# Patient Record
Sex: Male | Born: 1985 | Race: White | Hispanic: No | Marital: Married | Smoking: Current every day smoker
Health system: Southern US, Community
[De-identification: ages and names within clinical notes are randomized; demographics above are authoritative.]

## PROBLEM LIST (undated history)

## (undated) DIAGNOSIS — E782 Mixed hyperlipidemia: Secondary | ICD-10-CM

## (undated) DIAGNOSIS — K219 Gastro-esophageal reflux disease without esophagitis: Secondary | ICD-10-CM

## (undated) DIAGNOSIS — F411 Generalized anxiety disorder: Secondary | ICD-10-CM

## (undated) HISTORY — DX: Gastro-esophageal reflux disease without esophagitis: K21.9

## (undated) HISTORY — DX: Generalized anxiety disorder: F41.1

## (undated) HISTORY — PX: HERNIA REPAIR: SHX51

## (undated) HISTORY — DX: Mixed hyperlipidemia: E78.2

---

## 2015-01-08 ENCOUNTER — Encounter: Payer: Self-pay | Admitting: *Deleted

## 2015-01-08 ENCOUNTER — Telehealth: Payer: Self-pay | Admitting: Family Medicine

## 2015-01-08 NOTE — Telephone Encounter (Signed)
Pt given new pt appt with Lynwood Dawleyiffany Gann 5/17 at 9:30 and aware to arrive 30 minutes prior with a copy of insurance card. Pt states he doesn't take any medications on a regular basis currently.

## 2015-02-06 ENCOUNTER — Ambulatory Visit (INDEPENDENT_AMBULATORY_CARE_PROVIDER_SITE_OTHER): Payer: BLUE CROSS/BLUE SHIELD

## 2015-02-06 ENCOUNTER — Ambulatory Visit (INDEPENDENT_AMBULATORY_CARE_PROVIDER_SITE_OTHER): Payer: BLUE CROSS/BLUE SHIELD | Admitting: Physician Assistant

## 2015-02-06 ENCOUNTER — Encounter: Payer: Self-pay | Admitting: Physician Assistant

## 2015-02-06 VITALS — HR 70 | Temp 97.7°F | Ht 70.0 in | Wt 208.0 lb

## 2015-02-06 DIAGNOSIS — R2233 Localized swelling, mass and lump, upper limb, bilateral: Secondary | ICD-10-CM

## 2015-02-06 DIAGNOSIS — R2243 Localized swelling, mass and lump, lower limb, bilateral: Secondary | ICD-10-CM | POA: Diagnosis not present

## 2015-02-06 DIAGNOSIS — Z0000 Encounter for general adult medical examination without abnormal findings: Secondary | ICD-10-CM

## 2015-02-06 DIAGNOSIS — K219 Gastro-esophageal reflux disease without esophagitis: Secondary | ICD-10-CM

## 2015-02-06 LAB — POCT CBC
GRANULOCYTE PERCENT: 53.1 % (ref 37–80)
HCT, POC: 49.6 % (ref 43.5–53.7)
Hemoglobin: 16.1 g/dL (ref 14.1–18.1)
Lymph, poc: 2.2 (ref 0.6–3.4)
MCH, POC: 28.5 pg (ref 27–31.2)
MCHC: 32.4 g/dL (ref 31.8–35.4)
MCV: 88 fL (ref 80–97)
MPV: 7.7 fL (ref 0–99.8)
POC Granulocyte: 2.9 (ref 2–6.9)
POC LYMPH PERCENT: 40.8 %L (ref 10–50)
Platelet Count, POC: 176 10*3/uL (ref 142–424)
RBC: 5.63 M/uL (ref 4.69–6.13)
RDW, POC: 13 %
WBC: 5.4 10*3/uL (ref 4.6–10.2)

## 2015-02-06 MED ORDER — FAMOTIDINE 20 MG PO TABS: 20.0000 mg | ORAL_TABLET | Freq: Every day | ORAL | Status: DC

## 2015-02-06 NOTE — Progress Notes (Signed)
   Subjective:    Patient ID: Andre Hall, male    DOB: [birth year], 29 y.o.   MRN: [id number]  HPI 29 y/o male presents for establishment of care. He would like baseline labs. No complaints on todays visit.     Review of Systems  Constitutional: Negative.   HENT: Negative.   Eyes: Negative.   Respiratory: Negative.   Cardiovascular: Negative.   Gastrointestinal: Negative for nausea, vomiting, diarrhea, constipation and blood in stool.       Heartburn. Has tried Tums and baking soda with no relief  Endocrine: Negative.   Genitourinary: Negative.   Musculoskeletal: Negative.   Skin: Negative.   Allergic/Immunologic: Negative.   Neurological: Negative.   Hematological: Negative.   Psychiatric/Behavioral: Negative.        Objective:   Physical Exam  Constitutional: He is oriented to person, place, and time. He appears well-developed and well-nourished.  HENT:  Head: Normocephalic.  Right Ear: External ear normal.  Left Ear: External ear normal.  Nose: Nose normal.  Mouth/Throat: Oropharynx is clear and moist. No oropharyngeal exudate.  Eyes: Conjunctivae are normal. Pupils are equal, round, and reactive to light. Right eye exhibits no discharge. Left eye exhibits no discharge.  Neck: Normal range of motion. No thyromegaly present.  Cardiovascular: Normal rate, regular rhythm, normal heart sounds and intact distal pulses.  Exam reveals no gallop.   No murmur heard. Pulmonary/Chest: Effort normal and breath sounds normal. No respiratory distress. He has no wheezes. He has no rales. He exhibits no tenderness.  Abdominal: Soft.  Musculoskeletal: Normal range of motion. He exhibits no edema or tenderness.  Mobile nodules on bilateral 3rd fingers symmetric, resembling cyst Slightly mobile masses on bilateral arches of feet, symmetrical    Lymphadenopathy:    He has no cervical adenopathy.  Neurological: He is alert and oriented to person, place, and time.  Psychiatric: He has  a normal mood and affect. His behavior is normal. Judgment and thought content normal.  Nursing note and vitals reviewed.         Assessment & Plan:  1. Mass of hand, bilateral  - Rheumatoid factor - DG Hand Complete Left - DG Hand Complete Right - Ambulatory referral to Orthopedic Surgery  2. Preventative health care  - POCT CBC - CMP14+EGFR - Lipid panel - Vit D  25 hydroxy (rtn osteoporosis monitoring)  3. Gastroesophageal reflux disease, esophagitis presence not specified  - famotidine (PEPCID) 20 MG tablet; Take 1 tablet (20 mg total) by mouth daily.  Dispense: 30 tablet; Refill: 2  4. Mass of foot, bilateral  - Ambulatory referral to Orthopedic Surgery   Continue all meds Labs pending Health Maintenance reviewed Diet and exercise encouraged RTO 3 months   Rilyn Upshaw A. Benjamin Stain PA-C

## 2015-02-06 NOTE — Patient Instructions (Signed)
Gastroesophageal Reflux Disease, Adult °Gastroesophageal reflux disease (GERD) happens when acid from your stomach goes into your food pipe (esophagus). The acid can cause a burning feeling in your chest. Over time, the acid can make small holes (ulcers) in your food pipe.  °HOME CARE °· Ask your doctor for advice about: °¨ Losing weight. °¨ Quitting smoking. °¨ Alcohol use. °· Avoid foods and drinks that make your problems worse. You may want to avoid: °¨ Caffeine and alcohol. °¨ Chocolate. °¨ Mints. °¨ Garlic and onions. °¨ Spicy foods. °¨ Citrus fruits, such as oranges, lemons, or limes. °¨ Foods that contain tomato, such as sauce, chili, salsa, and pizza. °¨ Fried and fatty foods. °· Avoid lying down for 3 hours before you go to bed or before you take a nap. °· Eat small meals often, instead of large meals. °· Wear loose-fitting clothing. Do not wear anything tight around your waist. °· Raise (elevate) the head of your bed 6 to 8 inches with wood blocks. Using extra pillows does not help. °· Only take medicines as told by your doctor. °· Do not take aspirin or ibuprofen. °GET HELP RIGHT AWAY IF:  °· You have pain in your arms, neck, jaw, teeth, or back. °· Your pain gets worse or changes. °· You feel sick to your stomach (nauseous), throw up (vomit), or sweat (diaphoresis). °· You feel short of breath, or you pass out (faint). °· Your throw up is green, yellow, black, or looks like coffee grounds or blood. °· Your poop (stool) is red, bloody, or black. °MAKE SURE YOU:  °· Understand these instructions. °· Will watch your condition. °· Will get help right away if you are not doing well or get worse. °Document Released: 02/25/2008 Document Revised: 12/01/2011 Document Reviewed: 03/28/2011 °ExitCare® Patient Information ©2015 [employer]. This information is not intended to replace advice given to you by your health care provider. Make sure you discuss any questions you have with your health care provider. °Food  Choices for Gastroesophageal Reflux Disease °When you have gastroesophageal reflux disease (GERD), the foods you eat and your eating habits are very important. Choosing the right foods can help ease the discomfort of GERD. °WHAT GENERAL GUIDELINES DO I NEED TO FOLLOW? °· Choose fruits, vegetables, whole grains, low-fat dairy products, and low-fat meat, fish, and poultry. °· Limit fats such as oils, salad dressings, butter, nuts, and avocado. °· Keep a food diary to identify foods that cause symptoms. °· Avoid foods that cause reflux. These may be different for different people. °· Eat frequent small meals instead of three large meals each day. °· Eat your meals slowly, in a relaxed setting. °· Limit fried foods. °· Cook foods using methods other than frying. °· Avoid drinking alcohol. °· Avoid drinking large amounts of liquids with your meals. °· Avoid bending over or lying down until 2-3 hours after eating. °WHAT FOODS ARE NOT RECOMMENDED? °The following are some foods and drinks that may worsen your symptoms: °Vegetables °Tomatoes. Tomato juice. Tomato and spaghetti sauce. Chili peppers. Onion and garlic. Horseradish. °Fruits °Oranges, grapefruit, and lemon (fruit and juice). °Meats °High-fat meats, fish, and poultry. This includes hot dogs, ribs, ham, sausage, salami, and bacon. °Dairy °Whole milk and chocolate milk. Sour cream. Cream. Butter. Ice cream. Cream cheese.  °Beverages °Coffee and tea, with or without caffeine. Carbonated beverages or energy drinks. °Condiments °Hot sauce. Barbecue sauce.  °Sweets/Desserts °Chocolate and cocoa. Donuts. Peppermint and spearmint. °Fats and Oils °High-fat foods, including French fries and   potato chips. °Other °Vinegar. Strong spices, such as black pepper, white pepper, red pepper, cayenne, curry powder, cloves, ginger, and chili powder. °The items listed above may not be a complete list of foods and beverages to avoid. Contact your dietitian for more  information. °Document Released: 09/08/2005 Document Revised: 09/13/2013 Document Reviewed: 07/13/2013 °ExitCare® Patient Information ©2015 [employer]. This information is not intended to replace advice given to you by your health care provider. Make sure you discuss any questions you have with your health care provider. ° °

## 2015-02-07 ENCOUNTER — Other Ambulatory Visit: Payer: Self-pay | Admitting: Physician Assistant

## 2015-02-07 DIAGNOSIS — E7889 Other lipoprotein metabolism disorders: Secondary | ICD-10-CM

## 2015-02-07 DIAGNOSIS — R899 Unspecified abnormal finding in specimens from other organs, systems and tissues: Secondary | ICD-10-CM

## 2015-02-07 DIAGNOSIS — R229 Localized swelling, mass and lump, unspecified: Secondary | ICD-10-CM

## 2015-02-07 LAB — CMP14+EGFR
A/G RATIO: 2.5 (ref 1.1–2.5)
ALK PHOS: 66 IU/L (ref 39–117)
ALT: 14 IU/L (ref 0–44)
AST: 15 IU/L (ref 0–40)
Albumin: 4.7 g/dL (ref 3.5–5.5)
BUN/Creatinine Ratio: 19 (ref 8–19)
BUN: 19 mg/dL (ref 6–20)
Bilirubin Total: 0.5 mg/dL (ref 0.0–1.2)
CO2: 24 mmol/L (ref 18–29)
Calcium: 9.3 mg/dL (ref 8.7–10.2)
Chloride: 101 mmol/L (ref 97–108)
Creatinine, Ser: 1.01 mg/dL (ref 0.76–1.27)
GFR calc Af Amer: 117 mL/min/{1.73_m2} (ref >59)
GFR, EST NON AFRICAN AMERICAN: 101 mL/min/{1.73_m2} (ref >59)
GLOBULIN, TOTAL: 1.9 g/dL (ref 1.5–4.5)
Glucose: 107 mg/dL — ABNORMAL HIGH (ref 65–99)
Potassium: 4.5 mmol/L (ref 3.5–5.2)
SODIUM: 139 mmol/L (ref 134–144)
Total Protein: 6.6 g/dL (ref 6.0–8.5)

## 2015-02-07 LAB — RHEUMATOID FACTOR: Rhuematoid fact SerPl-aCnc: 14.6 IU/mL — ABNORMAL HIGH (ref 0.0–13.9)

## 2015-02-07 LAB — VITAMIN D 25 HYDROXY (VIT D DEFICIENCY, FRACTURES): VIT D 25 HYDROXY: 19.6 ng/mL — AB (ref 30.0–100.0)

## 2015-02-07 LAB — LIPID PANEL
Chol/HDL Ratio: 9.8 ratio units — ABNORMAL HIGH (ref 0.0–5.0)
Cholesterol, Total: 176 mg/dL (ref 100–199)
HDL: 18 mg/dL — ABNORMAL LOW (ref >39)
TRIGLYCERIDES: 858 mg/dL — AB (ref 0–149)

## 2015-02-08 ENCOUNTER — Telehealth: Payer: Self-pay | Admitting: *Deleted

## 2015-02-08 NOTE — Telephone Encounter (Signed)
lmtcb regarding test results. 

## 2015-02-08 NOTE — Telephone Encounter (Signed)
-----   Message from Chelsea Primusiffany A Gann, PA-C sent at 02/07/2015 11:09 AM EDT ----- Triglycerides very elevated.  +Rhematoid factor. Add'l tests needed Have patient rtc fasting asap for repeat of labs  Tiffany A. Chauncey ReadingGann PA-C

## 2015-02-15 ENCOUNTER — Other Ambulatory Visit (INDEPENDENT_AMBULATORY_CARE_PROVIDER_SITE_OTHER): Payer: BLUE CROSS/BLUE SHIELD

## 2015-02-15 DIAGNOSIS — R229 Localized swelling, mass and lump, unspecified: Secondary | ICD-10-CM | POA: Diagnosis not present

## 2015-02-15 DIAGNOSIS — E7889 Other lipoprotein metabolism disorders: Secondary | ICD-10-CM

## 2015-02-15 DIAGNOSIS — R899 Unspecified abnormal finding in specimens from other organs, systems and tissues: Secondary | ICD-10-CM

## 2015-02-15 NOTE — Progress Notes (Signed)
Lab only 

## 2015-02-16 LAB — LIPID PANEL
CHOL/HDL RATIO: 9.4 ratio — AB (ref 0.0–5.0)
Cholesterol, Total: 141 mg/dL (ref 100–199)
HDL: 15 mg/dL — AB (ref >39)
Triglycerides: 604 mg/dL (ref 0–149)

## 2015-02-16 LAB — C-REACTIVE PROTEIN: CRP: 2 mg/L (ref 0.0–4.9)

## 2015-02-16 LAB — RHEUMATOID FACTOR: Rhuematoid fact SerPl-aCnc: 8.8 IU/mL (ref 0.0–13.9)

## 2015-02-16 LAB — SEDIMENTATION RATE: Sed Rate: 2 mm/hr (ref 0–15)

## 2015-02-16 LAB — URIC ACID: URIC ACID: 8.9 mg/dL — AB (ref 3.7–8.6)

## 2015-02-16 LAB — ANA: Anti Nuclear Antibody(ANA): NEGATIVE

## 2015-03-05 ENCOUNTER — Ambulatory Visit (INDEPENDENT_AMBULATORY_CARE_PROVIDER_SITE_OTHER): Payer: BLUE CROSS/BLUE SHIELD | Admitting: Pharmacist

## 2015-03-05 ENCOUNTER — Encounter: Payer: Self-pay | Admitting: Pharmacist

## 2015-03-05 VITALS — BP 138/88 | HR 84 | Ht 70.0 in | Wt 203.0 lb

## 2015-03-05 DIAGNOSIS — R7989 Other specified abnormal findings of blood chemistry: Secondary | ICD-10-CM

## 2015-03-05 DIAGNOSIS — E782 Mixed hyperlipidemia: Secondary | ICD-10-CM | POA: Diagnosis not present

## 2015-03-05 DIAGNOSIS — E790 Hyperuricemia without signs of inflammatory arthritis and tophaceous disease: Secondary | ICD-10-CM | POA: Insufficient documentation

## 2015-03-05 DIAGNOSIS — R7309 Other abnormal glucose: Secondary | ICD-10-CM | POA: Diagnosis not present

## 2015-03-05 HISTORY — DX: Mixed hyperlipidemia: E78.2

## 2015-03-05 LAB — GLUCOSE, POCT (MANUAL RESULT ENTRY): POC Glucose: 102 mg/dl — AB (ref 70–99)

## 2015-03-05 LAB — POCT GLYCOSYLATED HEMOGLOBIN (HGB A1C): Hemoglobin A1C: 4.8

## 2015-03-05 MED ORDER — OMEPRAZOLE 20 MG PO CPDR: 20.0000 mg | DELAYED_RELEASE_CAPSULE | Freq: Every day | ORAL | Status: DC

## 2015-03-05 NOTE — Patient Instructions (Signed)
Managing Your High Blood Pressure Blood pressure is a measurement of how forceful your blood is pressing against the walls of the arteries. Arteries are muscular tubes within the circulatory system. Blood pressure does not stay the same. Blood pressure rises when you are active, excited, or nervous; and it lowers during sleep and relaxation. If the numbers measuring your blood pressure stay above normal most of the time, you are at risk for health problems. High blood pressure (hypertension) is a long-term (chronic) condition in which blood pressure is elevated. A blood pressure reading is recorded as two numbers, such as 120 over 80 (or 120/80). The first, higher number is called the systolic pressure. It is a measure of the pressure in your arteries as the heart beats. The second, lower number is called the diastolic pressure. It is a measure of the pressure in your arteries as the heart relaxes between beats.  Keeping your blood pressure in a normal range is important to your overall health and prevention of health problems, such as heart disease and stroke. When your blood pressure is uncontrolled, your heart has to work harder than normal. High blood pressure is a very common condition in adults because blood pressure tends to rise with age. Men and women are equally likely to have hypertension but at different times in life. Before age 45, men are more likely to have hypertension. After 27 years of age, women are more likely to have it. Hypertension is especially common in African Americans. This condition often has no signs or symptoms. The cause of the condition is usually not known. Your caregiver can help you come up with a plan to keep your blood pressure in a normal, healthy range. BLOOD PRESSURE STAGES Blood pressure is classified into four stages: normal, prehypertension, stage 1, and stage 2. Your blood pressure reading will be used to determine what type of treatment, if any, is necessary.  Appropriate treatment options are tied to these four stages:  Normal  Systolic pressure (mm Hg): below 120.  Diastolic pressure (mm Hg): below 80. Prehypertension  Systolic pressure (mm Hg): 120 to 139.  Diastolic pressure (mm Hg): 80 to 89. Stage1  Systolic pressure (mm Hg): 140 to 159.  Diastolic pressure (mm Hg): 90 to 99. Stage2  Systolic pressure (mm Hg): 160 or above.  Diastolic pressure (mm Hg): 100 or above. RISKS RELATED TO HIGH BLOOD PRESSURE Managing your blood pressure is an important responsibility. Uncontrolled high blood pressure can lead to:  A heart attack.  A stroke.  A weakened blood vessel (aneurysm).  Heart failure.  Kidney damage.  Eye damage.  Metabolic syndrome.  Memory and concentration problems. HOW TO MANAGE YOUR BLOOD PRESSURE Blood pressure can be managed effectively with lifestyle changes and medicines (if needed). Your caregiver will help you come up with a plan to bring your blood pressure within a normal range. Your plan should include the following: Education  Read all information provided by your caregivers about how to control blood pressure.  Educate yourself on the latest guidelines and treatment recommendations. New research is always being done to further define the risks and treatments for high blood pressure. Lifestylechanges  Control your weight.  Avoid smoking.  Stay physically active.  Reduce the amount of salt in your diet.  Reduce stress.  Control any chronic conditions, such as high cholesterol or diabetes.  Reduce your alcohol intake. Medicines  Several medicines (antihypertensive medicines) are available, if needed, to bring blood pressure within a normal range.   Communication  Review all the medicines you take with your caregiver because there may be side effects or interactions.  Talk with your caregiver about your diet, exercise habits, and other lifestyle factors that may be contributing to  high blood pressure.  See your caregiver regularly. Your caregiver can help you create and adjust your plan for managing high blood pressure. RECOMMENDATIONS FOR TREATMENT AND FOLLOW-UP  The following recommendations are based on current guidelines for managing high blood pressure in nonpregnant adults. Use these recommendations to identify the proper follow-up period or treatment option based on your blood pressure reading. You can discuss these options with your caregiver.  Systolic pressure of 120 to 139 or diastolic pressure of 80 to 89: Follow up with your caregiver as directed.  Systolic pressure of 140 to 160 or diastolic pressure of 90 to 100: Follow up with your caregiver within 2 months.  Systolic pressure above 160 or diastolic pressure above 100: Follow up with your caregiver within 1 month.  Systolic pressure above 180 or diastolic pressure above 110: Consider antihypertensive therapy; follow up with your caregiver within 1 week.  Systolic pressure above 200 or diastolic pressure above 120: Begin antihypertensive therapy; follow up with your caregiver within 1 week. Document Released: 06/02/2012 Document Reviewed: 06/02/2012 ExitCare Patient Information 2015 [employer]. This information is not intended to replace advice given to you by your health care provider. Make sure you discuss any questions you have with your health care provider.  DASH Eating Plan DASH stands for "Dietary Approaches to Stop Hypertension." The DASH eating plan is a healthy eating plan that has been shown to reduce high blood pressure (hypertension). Additional health benefits may include reducing the risk of type 2 diabetes mellitus, heart disease, and stroke. The DASH eating plan may also help with weight loss. WHAT DO I NEED TO KNOW ABOUT THE DASH EATING PLAN? For the DASH eating plan, you will follow these general guidelines:  Choose foods with a percent daily value for sodium of less than 5% (as  listed on the food label).  Use salt-free seasonings or herbs instead of table salt or sea salt.  Check with your health care provider or pharmacist before using salt substitutes.  Eat lower-sodium products, often labeled as "lower sodium" or "no salt added."  Eat fresh foods.  Eat more vegetables, fruits, and low-fat dairy products.  Choose whole grains. Look for the word "whole" as the first word in the ingredient list.  Choose fish and skinless chicken or turkey more often than red meat. Limit fish, poultry, and meat to 6 oz (170 g) each day.  Limit sweets, desserts, sugars, and sugary drinks.  Choose heart-healthy fats.  Limit cheese to 1 oz (28 g) per day.  Eat more home-cooked food and less restaurant, buffet, and fast food.  Limit fried foods.  Cook foods using methods other than frying.  Limit canned vegetables. If you do use them, rinse them well to decrease the sodium.  When eating at a restaurant, ask that your food be prepared with less salt, or no salt if possible. WHAT FOODS CAN I EAT? Seek help from a dietitian for individual calorie needs. Grains Whole grain or whole wheat bread. Brown rice. Whole grain or whole wheat pasta. Quinoa, bulgur, and whole grain cereals. Low-sodium cereals. Corn or whole wheat flour tortillas. Whole grain cornbread. Whole grain crackers. Low-sodium crackers. Vegetables Fresh or frozen vegetables (raw, steamed, roasted, or grilled). Low-sodium or reduced-sodium tomato and vegetable juices. Low-sodium   or reduced-sodium tomato sauce and paste. Low-sodium or reduced-sodium canned vegetables.  Fruits All fresh, canned (in natural juice), or frozen fruits. Meat and Other Protein Products Ground beef (85% or leaner), grass-fed beef, or beef trimmed of fat. Skinless chicken or turkey. Ground chicken or turkey. Pork trimmed of fat. All fish and seafood. Eggs. Dried beans, peas, or lentils. Unsalted nuts and seeds. Unsalted canned  beans. Dairy Low-fat dairy products, such as skim or 1% milk, 2% or reduced-fat cheeses, low-fat ricotta or cottage cheese, or plain low-fat yogurt. Low-sodium or reduced-sodium cheeses. Fats and Oils Tub margarines without trans fats. Light or reduced-fat mayonnaise and salad dressings (reduced sodium). Avocado. Safflower, olive, or canola oils. Natural peanut or almond butter. Other Unsalted popcorn and pretzels. The items listed above may not be a complete list of recommended foods or beverages. Contact your dietitian for more options. WHAT FOODS ARE NOT RECOMMENDED? Grains White bread. White pasta. White rice. Refined cornbread. Bagels and croissants. Crackers that contain trans fat. Vegetables Creamed or fried vegetables. Vegetables in a cheese sauce. Regular canned vegetables. Regular canned tomato sauce and paste. Regular tomato and vegetable juices. Fruits Dried fruits. Canned fruit in light or heavy syrup. Fruit juice. Meat and Other Protein Products Fatty cuts of meat. Ribs, chicken wings, bacon, sausage, bologna, salami, chitterlings, fatback, hot dogs, bratwurst, and packaged luncheon meats. Salted nuts and seeds. Canned beans with salt. Dairy Whole or 2% milk, cream, half-and-half, and cream cheese. Whole-fat or sweetened yogurt. Full-fat cheeses or blue cheese. Nondairy creamers and whipped toppings. Processed cheese, cheese spreads, or cheese curds. Condiments Onion and garlic salt, seasoned salt, table salt, and sea salt. Canned and packaged gravies. Worcestershire sauce. Tartar sauce. Barbecue sauce. Teriyaki sauce. Soy sauce, including reduced sodium. Steak sauce. Fish sauce. Oyster sauce. Cocktail sauce. Horseradish. Ketchup and mustard. Meat flavorings and tenderizers. Bouillon cubes. Hot sauce. Tabasco sauce. Marinades. Taco seasonings. Relishes. Fats and Oils Butter, stick margarine, lard, shortening, ghee, and bacon fat. Coconut, palm kernel, or palm oils. Regular salad  dressings. Other Pickles and olives. Salted popcorn and pretzels. The items listed above may not be a complete list of foods and beverages to avoid. Contact your dietitian for more information. WHERE CAN I FIND MORE INFORMATION? National Heart, Lung, and [employer] Institute: www.nhlbi.nih.gov/health/health-topics/topics/dash/ Document Released: 08/28/2011 Document Revised: 01/23/2014 Document Reviewed: 07/13/2013 ExitCare Patient Information 2015 [employer]. This information is not intended to replace advice given to you by your health care provider. Make sure you discuss any questions you have with your health care provider.  

## 2015-03-05 NOTE — Progress Notes (Signed)
Subjective:    Andre Hall is a 29 y.o. male who presents for evaluation of dyslipidemia. The patient does not use medications that may worsen dyslipidemias (corticosteroids, progestins, anabolic steroids, diuretics, beta-blockers, amiodarone, cyclosporine, olanzapine). Exercise: rarely. Previous history of cardiac disease includes: None.  Cardiac Risk Factors Age > 45-male, > 55-male:  NO  Smoking:   YES  +1  Sig. family hx of CHD*:  NO  Hypertension:   NO  Diabetes:   NO  HDL < 35:   YES  +1  HDL > 59:   NO  Total:  2   *Significant family history of CHD per NCEP = MI or sudden death at less than 55 year old in father or other 1st-degree male relative, or less than 20 year old in mother or  other 1st-degree male relative  The following portions of the patient's history were reviewed and updated as appropriate: allergies, current medications, past family history, past medical history, past social history, past surgical history and problem list.  Diet - 1 or 2 sodas or energy drinks per day.  Alcohol - mostly on weekends - and usually has greater than 4 drinks per day on weekends.  Eats out daily for lunch.   Lots of red meat.   Objective:   Lab Review Lab on 02/15/2015  Component Date Value  . Cholesterol, Total 02/15/2015 141   . Triglycerides 02/15/2015 604*  . HDL 02/15/2015 15*  . VLDL Cholesterol Cal 02/15/2015 Comment   . LDL Calculated 02/15/2015 Comment   . Chol/HDL Ratio 02/15/2015 9.4*  . Rhuematoid fact SerPl-aC* 02/15/2015 8.8   . Sed Rate 02/15/2015 2   . Uric Acid 02/15/2015 8.9*  . CRP 02/15/2015 2.0   . Anit Nuclear Antibody(AN* 02/15/2015 Negative   Office Visit on 02/06/2015  Component Date Value  . WBC 02/06/2015 5.4   . Lymph, poc 02/06/2015 2.2   . POC LYMPH PERCENT 02/06/2015 40.8   . POC Granulocyte 02/06/2015 2.9   . Granulocyte percent 02/06/2015 53.1   . RBC 02/06/2015 5.63   . Hemoglobin 02/06/2015 16.1   . HCT, POC 02/06/2015 49.6    . MCV 02/06/2015 88.0   . MCH, POC 02/06/2015 28.5   . MCHC 02/06/2015 32.4   . RDW, POC 02/06/2015 13.0   . Platelet Count, POC 02/06/2015 176   . MPV 02/06/2015 7.7   . Glucose 02/06/2015 107*  . BUN 02/06/2015 19   . Creatinine, Ser 02/06/2015 1.01   . GFR calc non Af Amer 02/06/2015 101   . GFR calc Af Amer 02/06/2015 117   . BUN/Creatinine Ratio 02/06/2015 19   . Sodium 02/06/2015 139   . Potassium 02/06/2015 4.5   . Chloride 02/06/2015 101   . CO2 02/06/2015 24   . Calcium 02/06/2015 9.3   . Total Protein 02/06/2015 6.6   . Albumin 02/06/2015 4.7   . Globulin, Total 02/06/2015 1.9   . Albumin/Globulin Ratio 02/06/2015 2.5   . Bilirubin Total 02/06/2015 0.5   . Alkaline Phosphatase 02/06/2015 66   . AST 02/06/2015 15   . ALT 02/06/2015 14   . Cholesterol, Total 02/06/2015 176   . Triglycerides 02/06/2015 858*  . HDL 02/06/2015 18*  . VLDL Cholesterol Cal 02/06/2015 Comment   . LDL Calculated 02/06/2015 Comment   . Chol/HDL Ratio 02/06/2015 9.8*  . Rhuematoid fact SerPl-aC* 02/06/2015 14.6*  . Vit D, 25-Hydroxy 02/06/2015 19.6*      Assessment:    Dyslipidemia as detailed above  with very elevated triglycerides possibly related to alcohol intake - 2 CHD risk factors using NCEP scheme above.  Target levels for LDL are: < 100 mg/dl (CHD or "CHD risk equivalent" is present)  Target Triglycerides are: < 150 Target HDL: < 40  Elevated uric acid      Plan:    The following changes are planned for the next 4 weeks, at which time the patient will return for repeat fasting lipids:  1. Dietary changes: Discussed d/c or limit alcohol intake, decrease foods with high CHO and sugar content.  Also discussed limiting red meat and alcohol to help decrease uric acid.  Increase non-starchy vegetables - carrots, green bean, squash, zucchini, tomatoes, onions, peppers, spinach and other green leafy vegetables, cabbage, lettuce, cucumbers, asparagus, okra (not fried),  eggplant limit sugar and processed foods (cakes, cookies, ice cream, crackers and chips) Increase fresh fruit but limit serving sizes 1/2 cup or about the size of tennis or baseball limit red meat to no more than 1-2 times per week (serving size about the size of your palm) Choose whole grains / lean proteins - whole wheat bread, quinoa, whole grain rice (1/2 cup), fish, chicken, [place]  2. Exercise changes:  Advised to engage in any type of physical activity and frequency goal is 4 to 5 times a week 3. Other treatment Smoking cessation (patient is at comtemplative stage. Information given. ) 4. Lipid-lowering medications: None at present. Will consider if LDL > 130  on recheck or if Triglycerides do not decrease to less than 200 5. Screening for secondary causes of dyslipidemias:Blood glucose and A1c done today - both were WNL. 6.  Explained to the patient the respective contributions of genetics, diet, and exercise to lipid levels and the use of medication in severe cases which do not respond to lifestyle alteration. The patient's interest and motivation in making lifestyle changes seems fair.    Note: The majority of the visit was spent in counseling on the pathophysiology and treatment of dyslipidemias. The total face-to-face time was in excess of 45 minutes.    Henrene Pastor, PharmD, CPP

## 2015-04-06 ENCOUNTER — Other Ambulatory Visit: Payer: BLUE CROSS/BLUE SHIELD

## 2015-04-06 DIAGNOSIS — E790 Hyperuricemia without signs of inflammatory arthritis and tophaceous disease: Secondary | ICD-10-CM

## 2015-04-06 DIAGNOSIS — E782 Mixed hyperlipidemia: Secondary | ICD-10-CM

## 2015-04-07 LAB — LIPID PANEL
CHOLESTEROL TOTAL: 131 mg/dL (ref 100–199)
Chol/HDL Ratio: 6.6 ratio units — ABNORMAL HIGH (ref 0.0–5.0)
HDL: 20 mg/dL — AB (ref >39)
TRIGLYCERIDES: 438 mg/dL — AB (ref 0–149)

## 2015-04-07 LAB — THYROID PANEL WITH TSH
Free Thyroxine Index: 1.9 (ref 1.2–4.9)
T3 Uptake Ratio: 25 % (ref 24–39)
T4, Total: 7.7 ug/dL (ref 4.5–12.0)
TSH: 1.74 u[IU]/mL (ref 0.450–4.500)

## 2015-04-07 LAB — URIC ACID: Uric Acid: 8.3 mg/dL (ref 3.7–8.6)

## 2015-04-07 LAB — LDL CHOLESTEROL, DIRECT: LDL DIRECT: 62 mg/dL (ref 0–99)

## 2015-04-12 ENCOUNTER — Telehealth: Payer: Self-pay | Admitting: Pharmacist

## 2015-04-12 ENCOUNTER — Other Ambulatory Visit: Payer: Self-pay | Admitting: Pharmacist

## 2015-04-12 DIAGNOSIS — E782 Mixed hyperlipidemia: Secondary | ICD-10-CM

## 2015-04-12 DIAGNOSIS — E790 Hyperuricemia without signs of inflammatory arthritis and tophaceous disease: Secondary | ICD-10-CM

## 2015-04-12 NOTE — Telephone Encounter (Signed)
Discussed results and recommendations with patient via phone.

## 2015-04-12 NOTE — Telephone Encounter (Signed)
Triglycerides have improved greatly over last 2 months but not at goal yet.  Direct LDL was WNL Uric acid is improving and thyroid panel was WNL.  Recommend continue with TLC - limiting sodas, sweets and alcoholic drinks.  Continue fish oil up to  daily.  Recheck lipids/direct LDL and UA in 6 weeks Tried to call patient - left message on VM

## 2015-05-05 ENCOUNTER — Other Ambulatory Visit: Payer: Self-pay | Admitting: Pharmacist

## 2015-05-24 ENCOUNTER — Other Ambulatory Visit: Payer: Self-pay

## 2015-07-09 ENCOUNTER — Telehealth: Payer: Self-pay | Admitting: Pharmacist

## 2015-07-09 NOTE — Telephone Encounter (Signed)
Patient missed appt to recheck labs - called to remind him to come in.

## 2015-07-16 ENCOUNTER — Telehealth: Payer: Self-pay | Admitting: Family Medicine

## 2015-07-24 ENCOUNTER — Telehealth: Payer: Self-pay | Admitting: Pharmacist

## 2015-07-24 NOTE — Telephone Encounter (Signed)
I called to remind patient that he is due to have lipds / LFTs rechecked and noticed that his wife called in last week about him having CP. I left message on both their cell numbers to remind about lipids and to follow up about CP.

## 2015-07-24 NOTE — Telephone Encounter (Signed)
Patient called back - states CP resolved and he did not have any further CP.  I did make him an appt to establish with a new PCP since Tiffany is no longer here and to have labs recheck . He is advised that is he has CP prior to next appt he is to call our office or 911.

## 2015-07-26 NOTE — Telephone Encounter (Signed)
Andre Hall spoke with Andre Hall

## 2015-08-13 ENCOUNTER — Ambulatory Visit: Payer: Self-pay | Admitting: Family Medicine

## 2015-08-14 ENCOUNTER — Encounter: Payer: Self-pay | Admitting: Physician Assistant

## 2015-10-01 ENCOUNTER — Other Ambulatory Visit: Payer: Self-pay | Admitting: Physician Assistant

## 2015-10-31 ENCOUNTER — Other Ambulatory Visit: Payer: Self-pay

## 2015-10-31 MED ORDER — OMEPRAZOLE 20 MG PO CPDR: DELAYED_RELEASE_CAPSULE | ORAL | Status: DC

## 2015-10-31 NOTE — Telephone Encounter (Signed)
Patient is due for follow-up this month. This is the last prescription that will be authorized before the patient is seen 

## 2015-10-31 NOTE — Telephone Encounter (Signed)
Last seen 02/06/16  Tiffany

## 2015-12-01 ENCOUNTER — Other Ambulatory Visit: Payer: Self-pay | Admitting: Family Medicine

## 2016-01-07 ENCOUNTER — Telehealth: Payer: Self-pay | Admitting: Pharmacist

## 2016-01-07 NOTE — Telephone Encounter (Signed)
Patient in need of appt for PCP - will have 02/05/16 off.  Appt made for Dr Ermalinda MemosBradshaw for 02/05/16 at 7:55am

## 2016-02-05 ENCOUNTER — Other Ambulatory Visit: Payer: Self-pay | Admitting: Family Medicine

## 2016-02-05 ENCOUNTER — Ambulatory Visit (INDEPENDENT_AMBULATORY_CARE_PROVIDER_SITE_OTHER): Payer: BLUE CROSS/BLUE SHIELD | Admitting: Family Medicine

## 2016-02-05 ENCOUNTER — Encounter: Payer: Self-pay | Admitting: Family Medicine

## 2016-02-05 ENCOUNTER — Telehealth: Payer: Self-pay | Admitting: Family Medicine

## 2016-02-05 ENCOUNTER — Ambulatory Visit (INDEPENDENT_AMBULATORY_CARE_PROVIDER_SITE_OTHER): Payer: BLUE CROSS/BLUE SHIELD

## 2016-02-05 VITALS — BP 142/88 | HR 68 | Temp 97.7°F | Ht 70.0 in | Wt 213.4 lb

## 2016-02-05 DIAGNOSIS — E790 Hyperuricemia without signs of inflammatory arthritis and tophaceous disease: Secondary | ICD-10-CM

## 2016-02-05 DIAGNOSIS — R0602 Shortness of breath: Secondary | ICD-10-CM

## 2016-02-05 DIAGNOSIS — R7989 Other specified abnormal findings of blood chemistry: Secondary | ICD-10-CM | POA: Diagnosis not present

## 2016-02-05 DIAGNOSIS — R222 Localized swelling, mass and lump, trunk: Secondary | ICD-10-CM

## 2016-02-05 DIAGNOSIS — E782 Mixed hyperlipidemia: Secondary | ICD-10-CM

## 2016-02-05 NOTE — Patient Instructions (Signed)
Great to meet you!  Congrats on quitting smoking!  We will call with your lab results within 1 week  Great job on watching your diet and exercising!

## 2016-02-05 NOTE — Telephone Encounter (Signed)
Nothing happens when you dial the  number on file for this patient; (it doesn't ring, give a busy tone, just dead )

## 2016-02-05 NOTE — Progress Notes (Signed)
   HPI  Patient presents today to establish care with me and to discuss shortest of breath and hypertriglyceridemia.  Patient states that he quit smoking about 3 months ago and had intermittent shortness of breath since that time He describes it as mild intermittent, annoying type shortness of breath. If he does not pay attention to it he never notices it. These exercising regularly running, walking, and lifting weights. He works TEFL teacher.  He's been watching his diet for about 2 months.  He's had elevated triglycerides, cholesterol, and uric acid in the past. He is still taking his fish oil.  He has heartburn symptoms every day, worse when lying down. It does not matter which food she eats   PMH: Smoking status noted Past medical history significant for hyperlipidemia in his mother, COPD and his grandfather ROS: Per HPI  Objective: BP 142/88 mmHg  Pulse 68  Temp(Src) 97.7 F (36.5 C) (Oral)  Ht '5\' 10"'$  (1.778 m)  Wt 213 lb 6.4 oz (96.798 kg)  BMI 30.62 kg/m2 Gen: NAD, alert, cooperative with exam HEENT: NCAT, nares with swollen turbinate on the left, TMs normal bilaterally, oropharynx clear CV: RRR, good S1/S2, no murmur Resp: CTABL, no wheezes, non-labored Abd: SNTND, BS present, no guarding or organomegaly Ext: No edema, warm Neuro: Alert and oriented, strength 5/5 and sensation intact in all 4 extremities, 2+ patellar tendon reflexes  Chest x-ray No acute findings  Assessment and plan:  # Hypertriglyceridemia Lipid panel today Congratulated on watching his diet and exercise  # Shortness of breath Unclear etiology, consider anxiety Chest x-ray today by request  # Elevated uric acid Repeating today No Issues with gout  # Low vitamin D Repeating today No supplements currently   Orders Placed This Encounter  Procedures  . DG Chest 2 View    Standing Status: Future     Number of Occurrences:      Standing Expiration Date:  04/06/2017    Order Specific Question:  Reason for Exam (SYMPTOM  OR DIAGNOSIS REQUIRED)    Answer:  dyspnea, mild, 2 months    Order Specific Question:  Preferred imaging location?    Answer:  External  . Lipid Panel  . CMP14+EGFR  . CBC with Differential  . TSH  . VITAMIN D 25 Hydroxy (Vit-D Deficiency, Fractures)  . Uric acid   Laroy Apple, MD [facility] 02/05/2016, 8:19 AM

## 2016-02-05 NOTE — Telephone Encounter (Signed)
Pt R/C and will come today for repeat CXR with nipple markers

## 2016-02-06 ENCOUNTER — Other Ambulatory Visit: Payer: Self-pay | Admitting: Family Medicine

## 2016-02-06 LAB — CMP14+EGFR
ALK PHOS: 84 IU/L (ref 39–117)
ALT: 31 IU/L (ref 0–44)
AST: 23 IU/L (ref 0–40)
Albumin/Globulin Ratio: 2.1 (ref 1.2–2.2)
Albumin: 4.6 g/dL (ref 3.5–5.5)
BUN/Creatinine Ratio: 16 (ref 9–20)
BUN: 16 mg/dL (ref 6–20)
Bilirubin Total: 0.4 mg/dL (ref 0.0–1.2)
CO2: 20 mmol/L (ref 18–29)
Calcium: 9.3 mg/dL (ref 8.7–10.2)
Chloride: 99 mmol/L (ref 96–106)
Creatinine, Ser: 0.97 mg/dL (ref 0.76–1.27)
GFR calc Af Amer: 122 mL/min/{1.73_m2} (ref >59)
GFR calc non Af Amer: 106 mL/min/{1.73_m2} (ref >59)
GLOBULIN, TOTAL: 2.2 g/dL (ref 1.5–4.5)
GLUCOSE: 114 mg/dL — AB (ref 65–99)
Potassium: 4.4 mmol/L (ref 3.5–5.2)
SODIUM: 141 mmol/L (ref 134–144)
Total Protein: 6.8 g/dL (ref 6.0–8.5)

## 2016-02-06 LAB — CBC WITH DIFFERENTIAL/PLATELET
Basophils Absolute: 0 10*3/uL (ref 0.0–0.2)
Basos: 0 %
EOS (ABSOLUTE): 0.1 10*3/uL (ref 0.0–0.4)
EOS: 2 %
Hematocrit: 46.2 % (ref 37.5–51.0)
Hemoglobin: 16.2 g/dL (ref 12.6–17.7)
IMMATURE GRANULOCYTES: 0 %
Immature Grans (Abs): 0 10*3/uL (ref 0.0–0.1)
LYMPHS: 43 %
Lymphocytes Absolute: 2 10*3/uL (ref 0.7–3.1)
MCH: 30.4 pg (ref 26.6–33.0)
MCHC: 35.1 g/dL (ref 31.5–35.7)
MCV: 87 fL (ref 79–97)
MONOS ABS: 0.3 10*3/uL (ref 0.1–0.9)
Monocytes: 6 %
NEUTROS PCT: 49 %
Neutrophils Absolute: 2.3 10*3/uL (ref 1.4–7.0)
PLATELETS: 193 10*3/uL (ref 150–379)
RBC: 5.33 x10E6/uL (ref 4.14–5.80)
RDW: 13.4 % (ref 12.3–15.4)
WBC: 4.7 10*3/uL (ref 3.4–10.8)

## 2016-02-06 LAB — LIPID PANEL
CHOLESTEROL TOTAL: 213 mg/dL — AB (ref 100–199)
Chol/HDL Ratio: 16.4 ratio units — ABNORMAL HIGH (ref 0.0–5.0)
HDL: 13 mg/dL — ABNORMAL LOW (ref >39)
Triglycerides: 1853 mg/dL (ref 0–149)

## 2016-02-06 LAB — VITAMIN D 25 HYDROXY (VIT D DEFICIENCY, FRACTURES): Vit D, 25-Hydroxy: 15.5 ng/mL — ABNORMAL LOW (ref 30.0–100.0)

## 2016-02-06 LAB — URIC ACID: URIC ACID: 9.8 mg/dL — AB (ref 3.7–8.6)

## 2016-02-06 LAB — TSH: TSH: 2.25 u[IU]/mL (ref 0.450–4.500)

## 2016-02-06 MED ORDER — VITAMIN D (ERGOCALCIFEROL) 1.25 MG (50000 UNIT) PO CAPS: 50000.0000 [IU] | ORAL_CAPSULE | ORAL | Status: DC

## 2016-02-06 MED ORDER — FENOFIBRATE 145 MG PO TABS: 145.0000 mg | ORAL_TABLET | Freq: Every day | ORAL | Status: DC

## 2016-02-07 ENCOUNTER — Telehealth: Payer: Self-pay | Admitting: Family Medicine

## 2016-02-07 DIAGNOSIS — E781 Pure hyperglyceridemia: Secondary | ICD-10-CM

## 2016-02-07 NOTE — Telephone Encounter (Signed)
Patient called stating that he is going to try to get his cholesterol down without medication.

## 2016-02-08 LAB — HGB A1C W/O EAG: HEMOGLOBIN A1C: 5.5 % (ref 4.8–5.6)

## 2016-02-08 LAB — SPECIMEN STATUS REPORT

## 2016-02-11 DIAGNOSIS — E781 Pure hyperglyceridemia: Secondary | ICD-10-CM | POA: Insufficient documentation

## 2016-02-11 NOTE — Addendum Note (Signed)
Addended by: Elenora GammaBRADSHAW, Destan Franchini L on: 02/11/2016 05:52 PM   Modules accepted: Orders

## 2016-02-11 NOTE — Telephone Encounter (Signed)
Called and discussed  Strongly recommended medication  He is interested in changing his diet and re-checking soon. I explained my skepticism about wether diet and exercise can make a large dent. He had a big party week at the beach. I am confident he will have a decrease but I am concerned that he will still be very well above 500.   Explained risk of heart disease and pancreatitis with very elevated Trigs.   Murtis SinkSam Shanikia Kernodle, MD [facility] 02/11/2016, 5:52 PM

## 2016-05-30 ENCOUNTER — Ambulatory Visit: Payer: BLUE CROSS/BLUE SHIELD | Admitting: Family Medicine

## 2016-06-02 ENCOUNTER — Encounter: Payer: Self-pay | Admitting: Family Medicine

## 2016-12-08 IMAGING — CR DG HAND COMPLETE 3+V*R*
3 series · 3 of 3 positions shown · non-contrast
Comparison: None.

CLINICAL DATA: Hand mass.  Initial evaluation .

EXAM:
RIGHT HAND - COMPLETE 3+ VIEW

[view not recorded (1 of 3)]
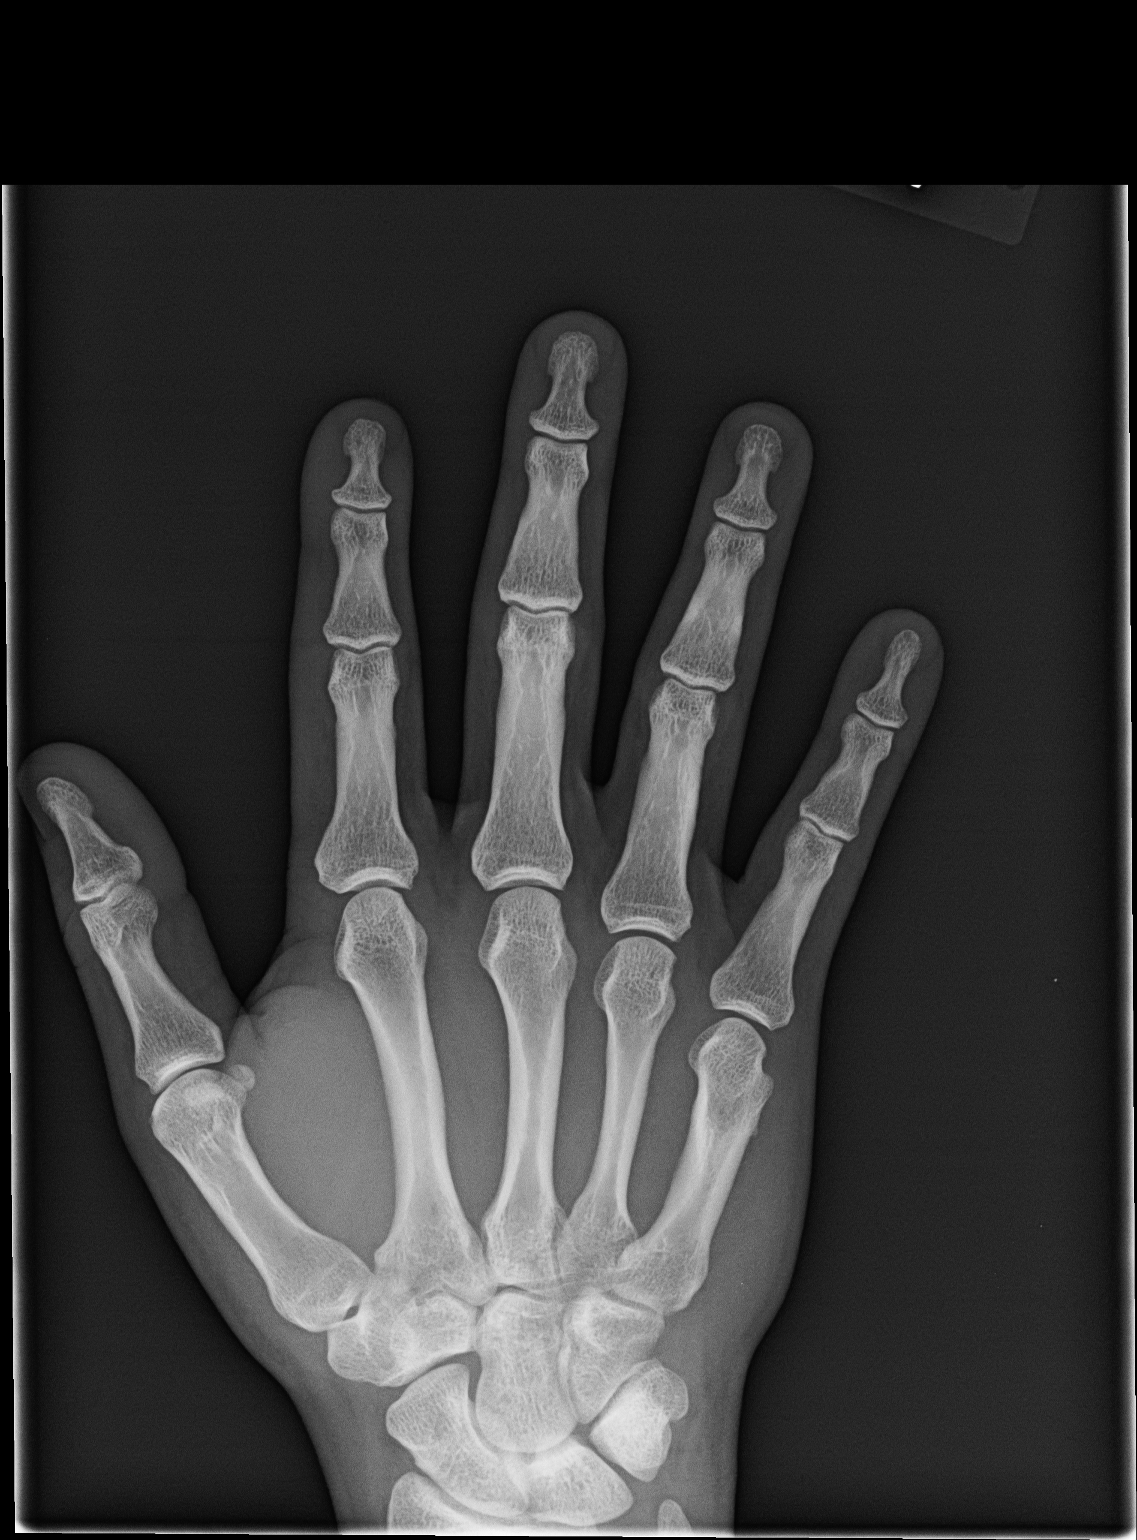

[view not recorded (2 of 3)]
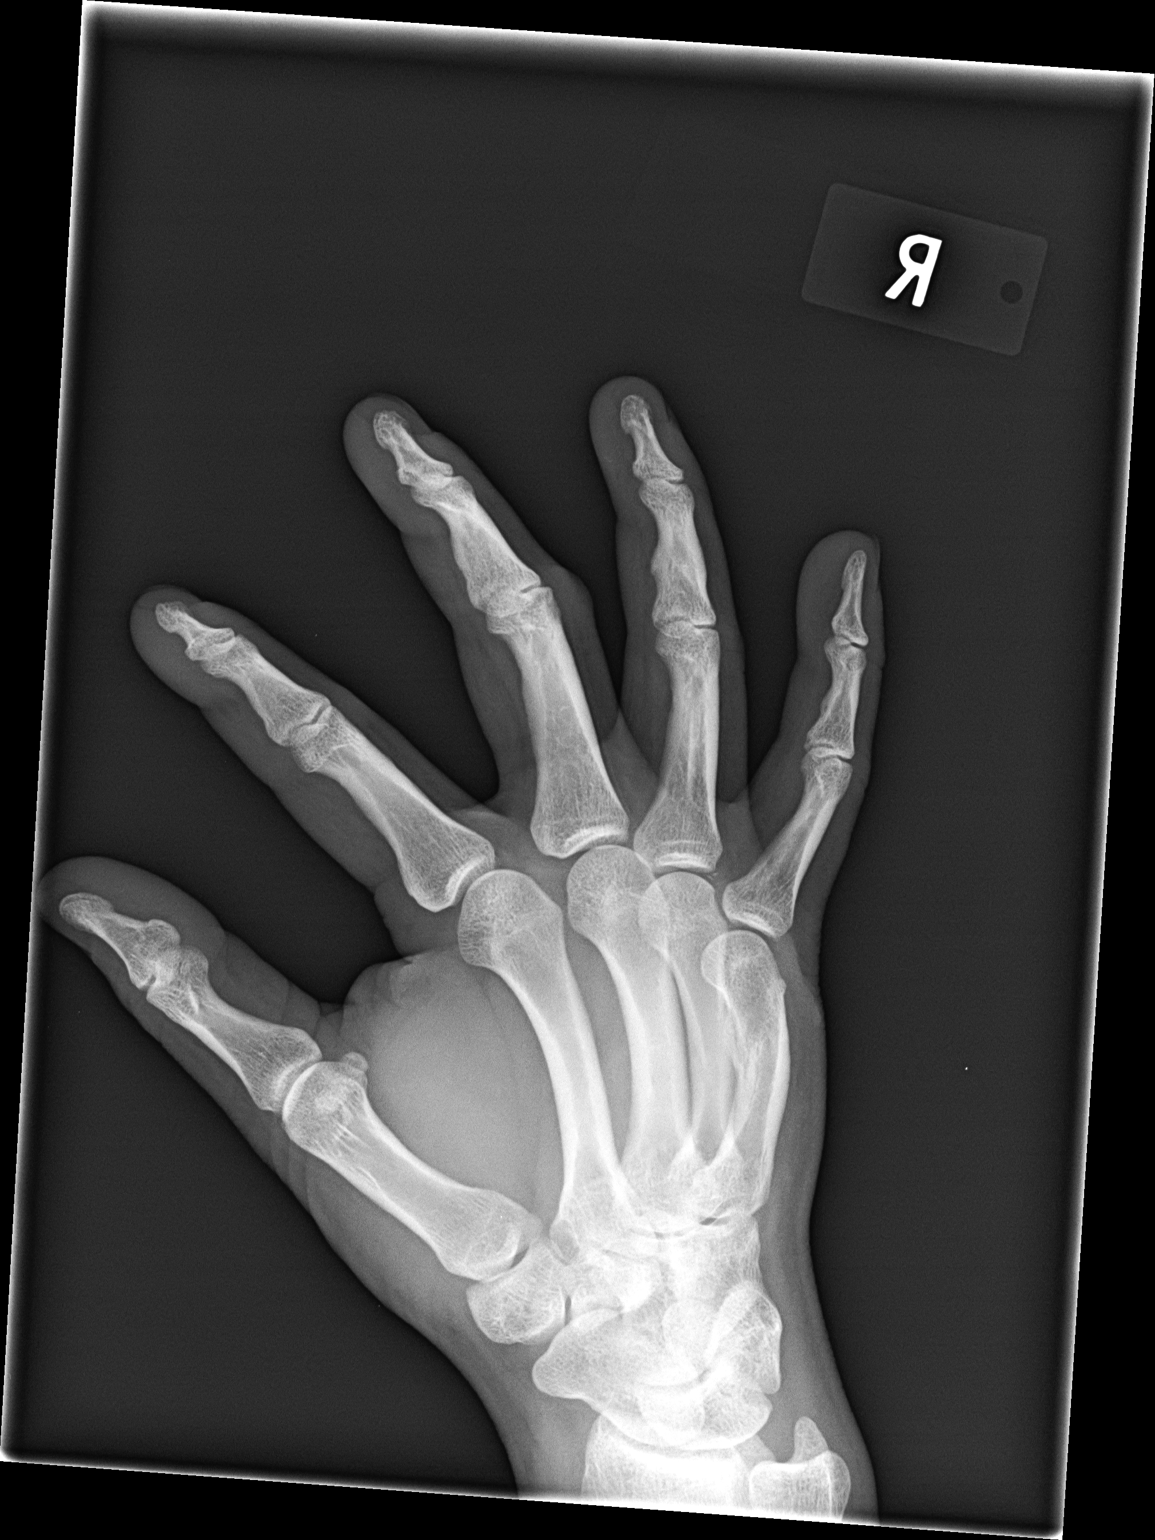

[view not recorded (3 of 3)]
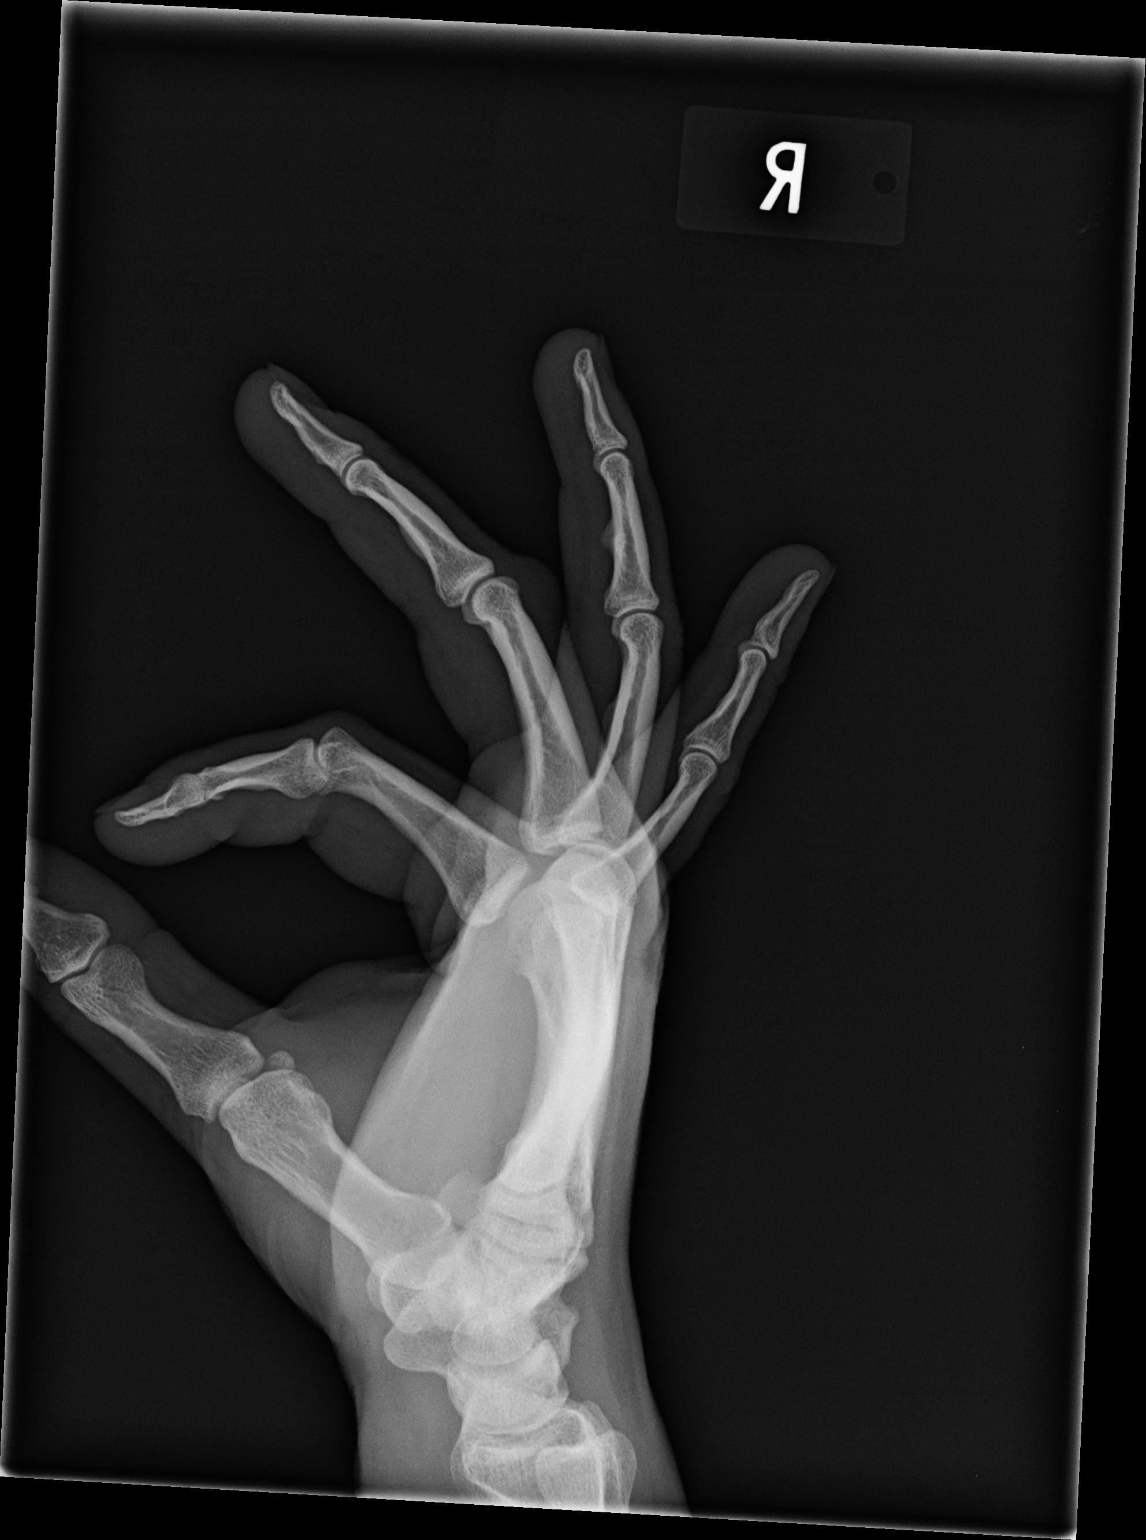

[3 of 3 positions shown; findings below may reference images not displayed]

FINDINGS: Deformity noted of the right fifth metatarsal most likely related to
prior healed fracture. No acute bony abnormality identified. No
evidence of acute fracture or dislocation. Soft tissue structures
are unremarkable. No radiopaque foreign body.
IMPRESSION: Old healed right fifth metacarpal fracture. No acute abnormality
noted. No focal soft tissue lesion identified. No radiopaque foreign
body.

## 2016-12-08 IMAGING — CR DG HAND COMPLETE 3+V*L*
3 series · 3 of 3 positions shown · non-contrast
Comparison: Right hand series of today's date

CLINICAL DATA: Bilateral third digit hand masses

EXAM:
LEFT HAND - COMPLETE 3+ VIEW

[view not recorded (1 of 3)]
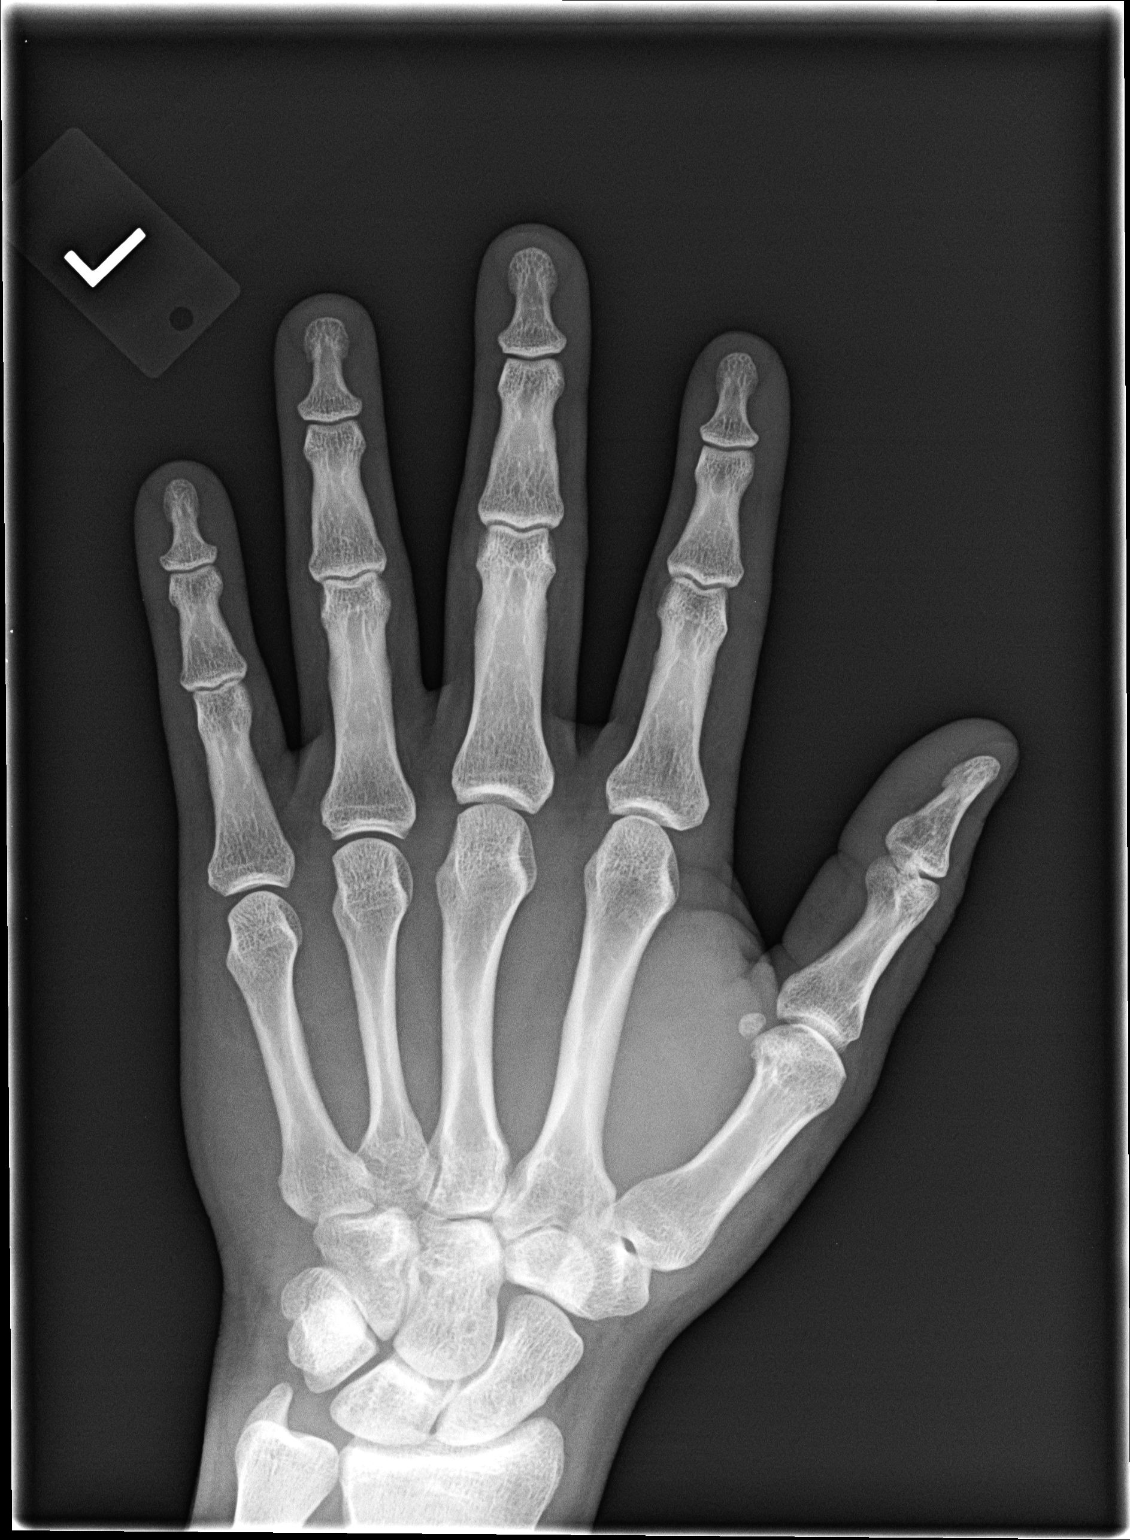

[view not recorded (2 of 3)]
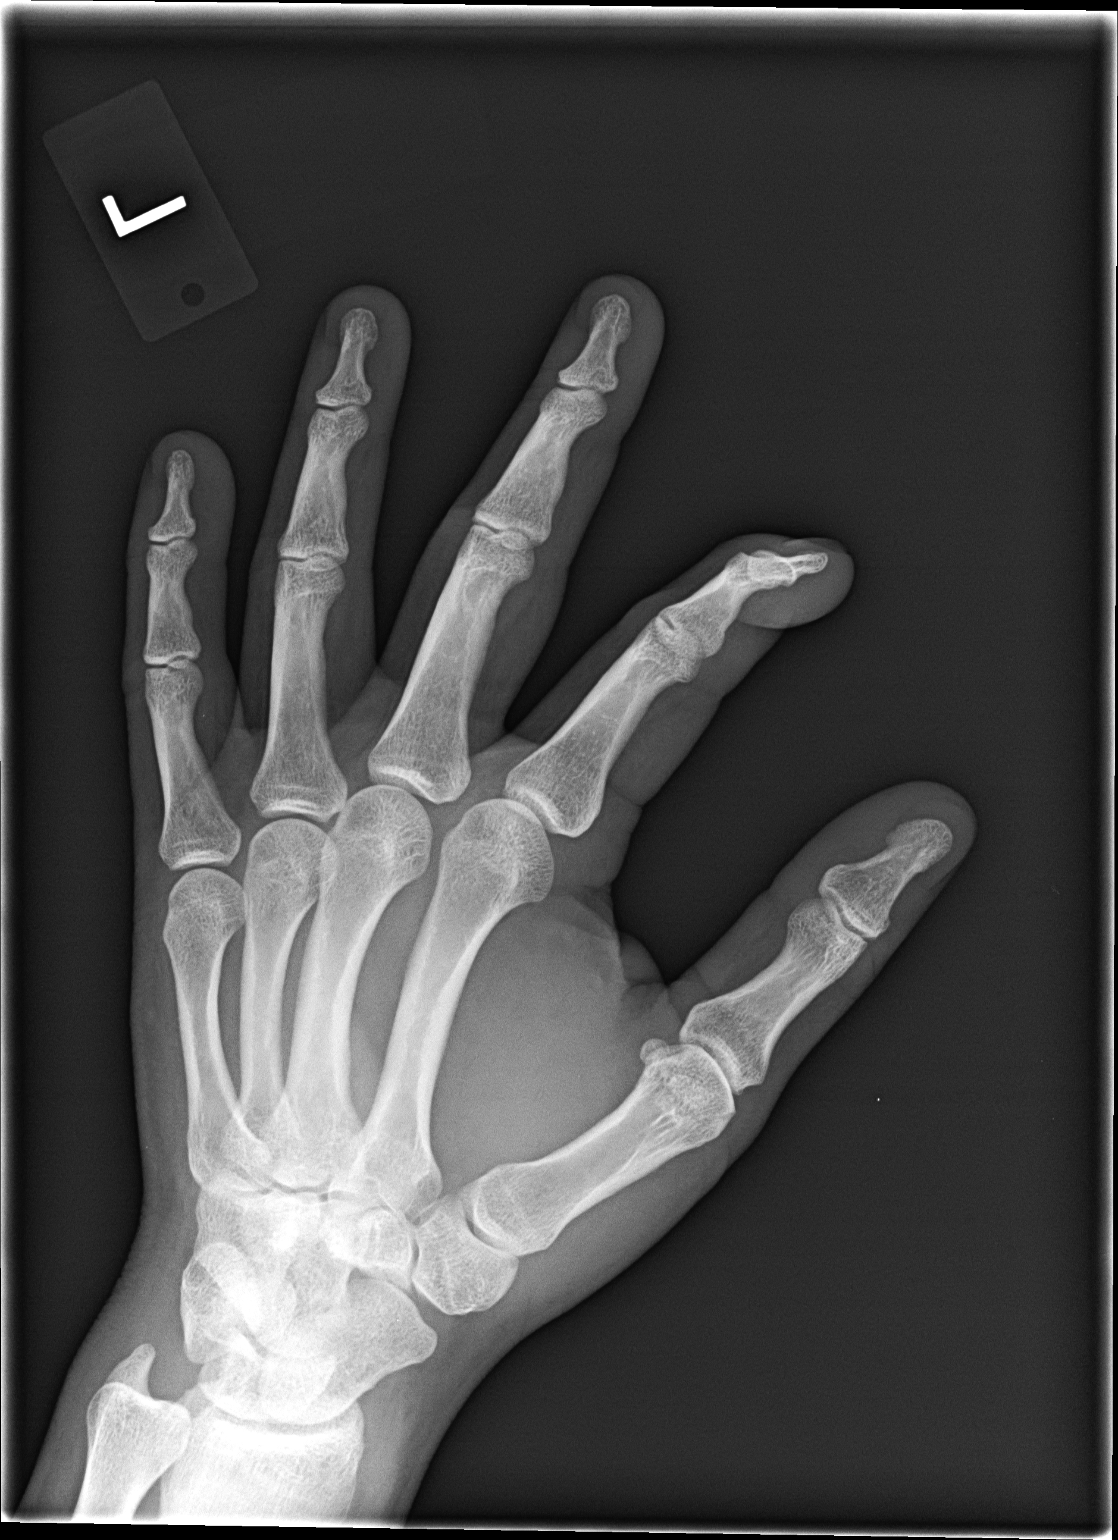

[view not recorded (3 of 3)]
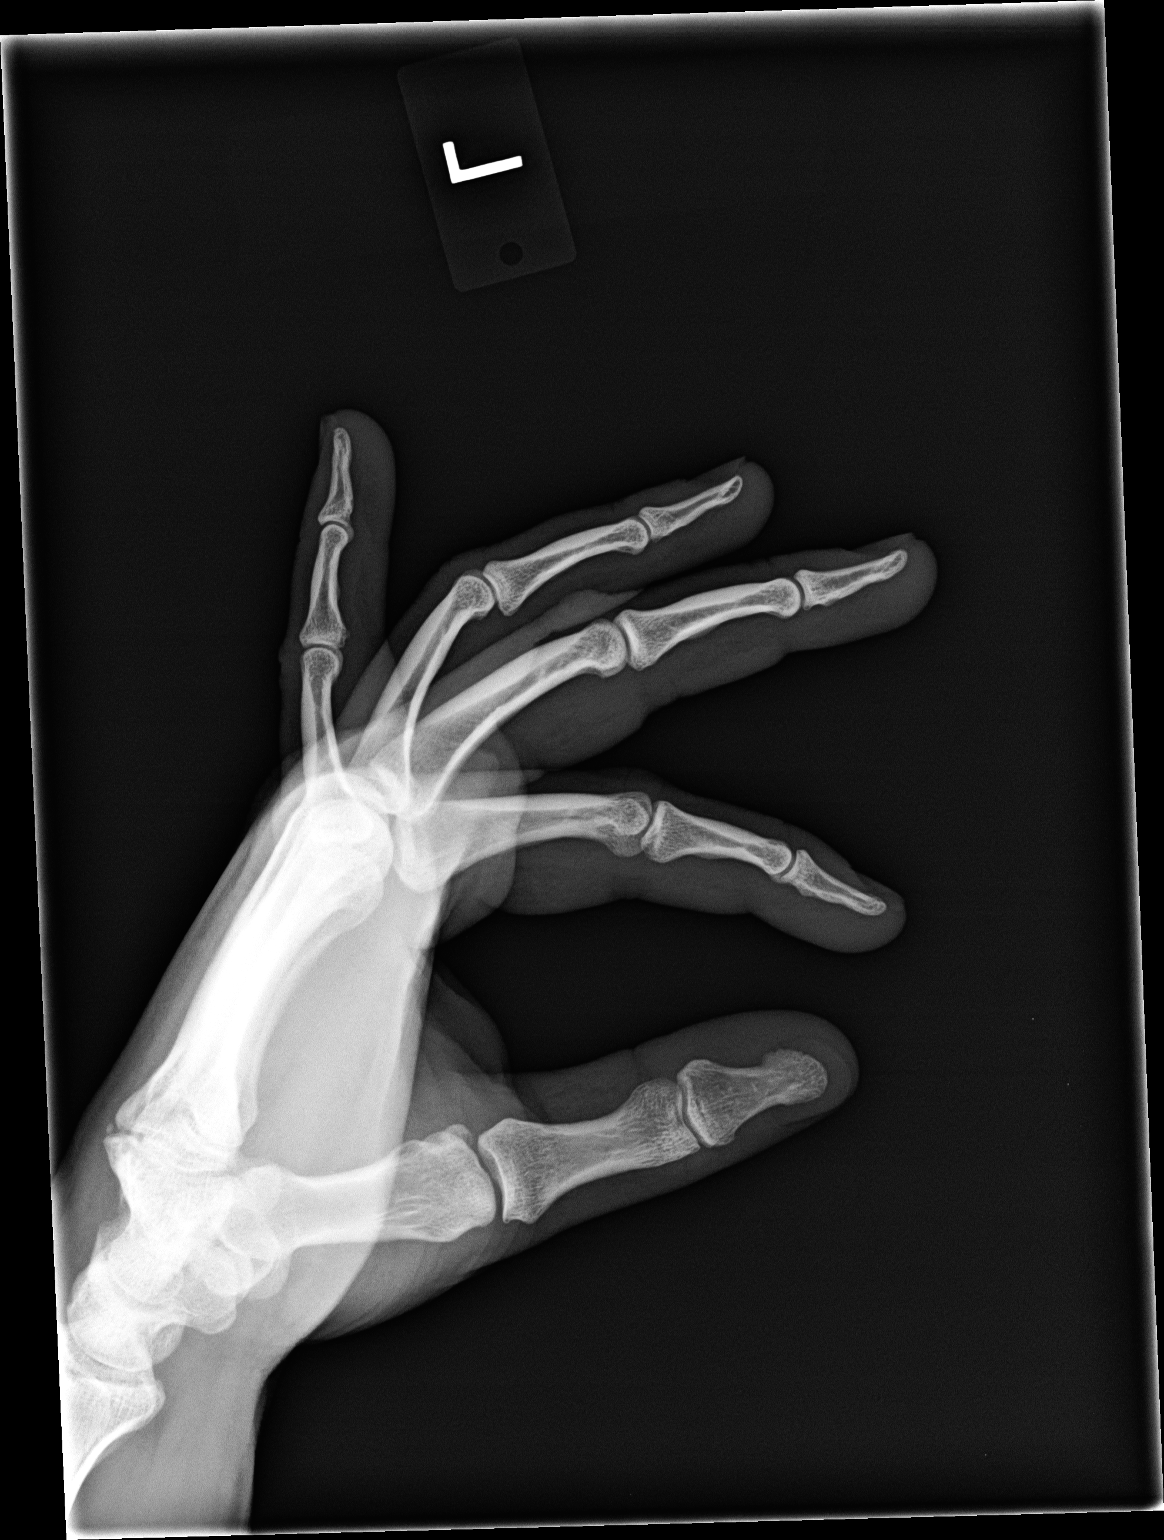

[3 of 3 positions shown; findings below may reference images not displayed]

FINDINGS: The bones of the left hand are adequately mineralized. There is no
lytic or blastic lesion. The joint spaces are preserved. The soft
tissues exhibit no calcified or soft tissue density masses. Specific
attention to the third digit reveals no bony or soft tissue
abnormality.
IMPRESSION: There is no acute bony abnormality of the left hand. No cystic
lesion in the third digit is demonstrated.

## 2018-03-30 ENCOUNTER — Ambulatory Visit: Payer: BLUE CROSS/BLUE SHIELD | Admitting: Nurse Practitioner

## 2018-03-30 ENCOUNTER — Encounter: Payer: Self-pay | Admitting: Nurse Practitioner

## 2018-03-30 VITALS — BP 139/83 | HR 77 | Temp 97.6°F | Ht 70.0 in | Wt 213.0 lb

## 2018-03-30 DIAGNOSIS — R197 Diarrhea, unspecified: Secondary | ICD-10-CM | POA: Diagnosis not present

## 2018-03-30 DIAGNOSIS — B349 Viral infection, unspecified: Secondary | ICD-10-CM | POA: Diagnosis not present

## 2018-03-30 DIAGNOSIS — K219 Gastro-esophageal reflux disease without esophagitis: Secondary | ICD-10-CM | POA: Diagnosis not present

## 2018-03-30 DIAGNOSIS — M940 Chondrocostal junction syndrome [Tietze]: Secondary | ICD-10-CM | POA: Diagnosis not present

## 2018-03-30 DIAGNOSIS — R300 Dysuria: Secondary | ICD-10-CM

## 2018-03-30 MED ORDER — CYCLOBENZAPRINE HCL 5 MG PO TABS: 5.0000 mg | ORAL_TABLET | Freq: Three times a day (TID) | ORAL | 0 refills | Status: DC | PRN

## 2018-03-30 MED ORDER — PANTOPRAZOLE SODIUM 40 MG PO TBEC: 40.0000 mg | DELAYED_RELEASE_TABLET | Freq: Every day | ORAL | 3 refills | Status: DC

## 2018-03-30 MED ORDER — NYSTATIN 100000 UNIT/GM EX CREA: 1.0000 "application " | TOPICAL_CREAM | Freq: Two times a day (BID) | CUTANEOUS | 0 refills | Status: DC

## 2018-03-30 NOTE — Progress Notes (Signed)
   Subjective:    Patient ID: Andre Hall Kamps, male    DOB: 25-Jun-1986, 32 y.o.   MRN: [id number]   Chief Complaint: vomiting and diarrhea  HPI [facility] c/o chest pain and elevated blood pressure. They checked heart, chest xray and blood work. He was told everything was normal. Today he is still having chest pain with tingling going up to chin. Pain in left side chest is stabbing pain with burning sensation across chest. He was sent home from ER and was told he had a virus and to just rest. Last night he developed diarrhea.rates pain right now 5/10 and pain increases with movement.   Review of Systems  Constitutional: Positive for appetite change (decreased). Negative for chills and fever.  HENT: Negative.   Respiratory: Negative.   Cardiovascular: Positive for chest pain. Negative for palpitations and leg swelling.  Gastrointestinal: Positive for abdominal pain and diarrhea.  Genitourinary: Positive for dysuria, frequency and urgency (some).  Neurological: Negative.   Psychiatric/Behavioral: Negative.   All other systems reviewed and are negative.      Objective:   Physical Exam  Constitutional: He is oriented to person, place, and time. He appears well-developed. He appears distressed (mild).  HENT:  Head: Normocephalic.  Right Ear: External ear normal.  Left Ear: External ear normal.  Nose: Nose normal.  Mouth/Throat: Oropharynx is clear and moist.  Eyes: Pupils are equal, round, and reactive to light. EOM are normal.  Neck: Normal range of motion. Neck supple.  Cardiovascular: Normal rate and regular rhythm.  Pulmonary/Chest: Effort normal and breath sounds normal.  Abdominal: Soft. There is no tenderness.  hyperactive bowel sounds  Musculoskeletal:  Mild increase in pain on palpation of chest wall.  Lymphadenopathy:    He has no cervical adenopathy.  Neurological: He is alert and oriented to person, place, and time. No cranial nerve  deficit.  Skin: Skin is warm.  Psychiatric: He has a normal mood and affect. His behavior is normal. Judgment and thought content normal.   BP 139/83   Pulse 77   Temp 97.6 F (36.4 C) (Oral)   Ht 5\' 10"  (1.778 m)   Wt 213 lb (96.6 kg)   BMI 30.56 kg/m   Urine clear       Assessment & Plan:  Andre Hall Josephson in today with chief complaint of No chief complaint on file.   1. Viral syndrome Force fluids rest  2. Gastroesophageal reflux disease without esophagitis Avoid spicy foods Do not eat 2 hours prior to bedtime - pantoprazole (PROTONIX) 40 MG tablet; Take 1 tablet (40 mg total) by mouth daily.  Dispense: 30 tablet; Refill: 3  3. Diarrhea, unspecified type Imodium AD OTC as needed  4. Acute costochondritis Cannot do NSaid due to reflux - cyclobenzaprine (FLEXERIL) 5 MG tablet; Take 1 tablet (5 mg total) by mouth 3 (three) times daily as needed for muscle spasms.  Dispense: 30 tablet; Refill: 0  5. Dysuria This may have been caused by diarrhea - nystatin cream (MYCOSTATIN); Apply 1 application topically 2 (two) times daily.  Dispense: 30 g; Refill: 0  Will call for records from [facility]  Mary-Margaret Balcones HeightsMartin, [place]NP

## 2018-09-03 ENCOUNTER — Encounter: Payer: Self-pay | Admitting: Family Medicine

## 2018-09-03 ENCOUNTER — Ambulatory Visit (INDEPENDENT_AMBULATORY_CARE_PROVIDER_SITE_OTHER): Payer: BLUE CROSS/BLUE SHIELD | Admitting: Family Medicine

## 2018-09-03 VITALS — BP 142/88 | HR 72 | Temp 99.9°F | Ht 70.0 in | Wt 213.0 lb

## 2018-09-03 DIAGNOSIS — F411 Generalized anxiety disorder: Secondary | ICD-10-CM

## 2018-09-03 DIAGNOSIS — Z720 Tobacco use: Secondary | ICD-10-CM

## 2018-09-03 DIAGNOSIS — Z0000 Encounter for general adult medical examination without abnormal findings: Secondary | ICD-10-CM

## 2018-09-03 DIAGNOSIS — E782 Mixed hyperlipidemia: Secondary | ICD-10-CM | POA: Insufficient documentation

## 2018-09-03 DIAGNOSIS — Z6830 Body mass index (BMI) 30.0-30.9, adult: Secondary | ICD-10-CM | POA: Insufficient documentation

## 2018-09-03 DIAGNOSIS — Z23 Encounter for immunization: Secondary | ICD-10-CM

## 2018-09-03 DIAGNOSIS — E559 Vitamin D deficiency, unspecified: Secondary | ICD-10-CM | POA: Diagnosis not present

## 2018-09-03 DIAGNOSIS — J0100 Acute maxillary sinusitis, unspecified: Secondary | ICD-10-CM

## 2018-09-03 DIAGNOSIS — R030 Elevated blood-pressure reading, without diagnosis of hypertension: Secondary | ICD-10-CM

## 2018-09-03 DIAGNOSIS — K219 Gastro-esophageal reflux disease without esophagitis: Secondary | ICD-10-CM | POA: Insufficient documentation

## 2018-09-03 DIAGNOSIS — Z789 Other specified health status: Secondary | ICD-10-CM

## 2018-09-03 DIAGNOSIS — Z0001 Encounter for general adult medical examination with abnormal findings: Secondary | ICD-10-CM

## 2018-09-03 HISTORY — DX: Generalized anxiety disorder: F41.1

## 2018-09-03 MED ORDER — AMOXICILLIN-POT CLAVULANATE 875-125 MG PO TABS: 1.0000 | ORAL_TABLET | Freq: Two times a day (BID) | ORAL | 0 refills | Status: AC

## 2018-09-03 MED ORDER — SERTRALINE HCL 50 MG PO TABS: 50.0000 mg | ORAL_TABLET | Freq: Every day | ORAL | 5 refills | Status: DC

## 2018-09-03 MED ORDER — FLUTICASONE PROPIONATE 50 MCG/ACT NA SUSP: 2.0000 | Freq: Every day | NASAL | 6 refills | Status: DC

## 2018-09-03 MED ORDER — OMEPRAZOLE 20 MG PO CPDR: 20.0000 mg | DELAYED_RELEASE_CAPSULE | Freq: Every day | ORAL | 3 refills | Status: DC

## 2018-09-03 NOTE — Patient Instructions (Addendum)
Health Maintenance, Male A healthy lifestyle and preventive care is important for your health and wellness. Ask your health care provider about what schedule of regular examinations is right for you. What should I know about weight and diet? Eat a Healthy Diet  Eat plenty of vegetables, fruits, whole grains, low-fat dairy products, and lean protein.  Do not eat a lot of foods high in solid fats, added sugars, or salt.  Maintain a Healthy Weight Regular exercise can help you achieve or maintain a healthy weight. You should:  Do at least 150 minutes of exercise each week. The exercise should increase your heart rate and make you sweat (moderate-intensity exercise).  Do strength-training exercises at least twice a week.  Watch Your Levels of Cholesterol and Blood Lipids  Have your blood tested for lipids and cholesterol every 5 years starting at 30 years of age. If you are at high risk for heart disease, you should start having your blood tested when you are 30 years old. You may need to have your cholesterol levels checked more often if: ? Your lipid or cholesterol levels are high. ? You are older than 30 years of age. ? You are at high risk for heart disease.  What should I know about cancer screening? Many types of cancers can be detected early and may often be prevented. Lung Cancer  You should be screened every year for lung cancer if: ? You are a current smoker who has smoked for at least 30 years. ? You are a former smoker who has quit within the past 15 years.  Talk to your health care provider about your screening options, when you should start screening, and how often you should be screened.  Colorectal Cancer  Routine colorectal cancer screening usually begins at 30 years of age and should be repeated every 5-10 years until you are 30 years old. You may need to be screened more often if early forms of precancerous polyps or small growths are found. Your health care  provider may recommend screening at an earlier age if you have risk factors for colon cancer.  Your health care provider may recommend using home test kits to check for hidden blood in the stool.  A small camera at the end of a tube can be used to examine your colon (sigmoidoscopy or colonoscopy). This checks for the earliest forms of colorectal cancer.  Prostate and Testicular Cancer  Depending on your age and overall health, your health care provider may do certain tests to screen for prostate and testicular cancer.  Talk to your health care provider about any symptoms or concerns you have about testicular or prostate cancer.  Skin Cancer  Check your skin from head to toe regularly.  Tell your health care provider about any new moles or changes in moles, especially if: ? There is a change in a mole's size, shape, or color. ? You have a mole that is larger than a pencil eraser.  Always use sunscreen. Apply sunscreen liberally and repeat throughout the day.  Protect yourself by wearing long sleeves, pants, a wide-brimmed hat, and sunglasses when outside.  What should I know about heart disease, diabetes, and high blood pressure?  If you are 44-22 years of age, have your blood pressure checked every 3-5 years. If you are 63 years of age or older, have your blood pressure checked every year. You should have your blood pressure measured twice-once when you are at a hospital or clinic, and once  when you are not at a hospital or clinic. Record the average of the two measurements. To check your blood pressure when you are not at a hospital or clinic, you can use: ? An automated blood pressure machine at a pharmacy. ? A home blood pressure monitor.  Talk to your health care provider about your target blood pressure.  If you are between 38-5 years old, ask your health care provider if you should take aspirin to prevent heart disease.  Have regular diabetes screenings by checking your  fasting blood sugar level. ? If you are at a normal weight and have a low risk for diabetes, have this test once every three years after the age of 52. ? If you are overweight and have a high risk for diabetes, consider being tested at a younger age or more often.  A one-time screening for abdominal aortic aneurysm (AAA) by ultrasound is recommended for men aged 65-75 years who are current or former smokers. What should I know about preventing infection? Hepatitis B If you have a higher risk for hepatitis B, you should be screened for this virus. Talk with your health care provider to find out if you are at risk for hepatitis B infection. Hepatitis C Blood testing is recommended for:  Everyone born from [birth year] through 1963.  Anyone with known risk factors for hepatitis C.  Sexually Transmitted Diseases (STDs)  You should be screened each year for STDs including gonorrhea and chlamydia if: ? You are sexually active and are younger than 30 years of age. ? You are older than 30 years of age and your health care provider tells you that you are at risk for this type of infection. ? Your sexual activity has changed since you were last screened and you are at an increased risk for chlamydia or gonorrhea. Ask your health care provider if you are at risk.  Talk with your health care provider about whether you are at high risk of being infected with HIV. Your health care provider may recommend a prescription medicine to help prevent HIV infection.  What else can I do?  Schedule regular health, dental, and eye exams.  Stay current with your vaccines (immunizations).  Do not use any tobacco products, such as cigarettes, chewing tobacco, and e-cigarettes. If you need help quitting, ask your health care provider.  Limit alcohol intake to no more than 2 drinks per day. One drink equals 12 ounces of beer, 5 ounces of wine, or 1 ounces of hard liquor.  Do not use street drugs.  Do not share  needles.  Ask your health care provider for help if you need support or information about quitting drugs.  Tell your health care provider if you often feel depressed.  Tell your health care provider if you have ever been abused or do not feel safe at home. This information is not intended to replace advice given to you by your health care provider. Make sure you discuss any questions you have with your health care provider. Document Released: 03/06/2008 Document Revised: 05/07/2016 Document Reviewed: 06/12/2015 Elsevier Interactive Patient Education  2018 [employer].  Calorie Counting for [employer] Calories are units of energy. Your body needs a certain amount of calories from food to keep you going throughout the day. When you eat more calories than your body needs, your body stores the extra calories as fat. When you eat fewer calories than your body needs, your body burns fat to get the energy it needs.  Calorie counting means keeping track of how many calories you eat and drink each day. Calorie counting can be helpful if you need to lose weight. If you make sure to eat fewer calories than your body needs, you should lose weight. Ask your health care provider what a healthy weight is for you. For calorie counting to work, you will need to eat the right number of calories in a day in order to lose a healthy amount of weight per week. A dietitian can help you determine how many calories you need in a day and will give you suggestions on how to reach your calorie goal.  A healthy amount of weight to lose per week is usually 1-2 lb (0.5-0.9 kg). This usually means that your daily calorie intake should be reduced by 500-750 calories.  Eating 1,200 - 1,500 calories per day can help most women lose weight.  Eating 1,500 - 1,800 calories per day can help most men lose weight.  What is my plan? My goal is to have __________ calories per day. If I have this many calories per day, I should lose  around __________ pounds per week. What do I need to know about calorie counting? In order to meet your daily calorie goal, you will need to:  Find out how many calories are in each food you would like to eat. Try to do this before you eat.  Decide how much of the food you plan to eat.  Write down what you ate and how many calories it had. Doing this is called keeping a food log.  To successfully lose weight, it is important to balance calorie counting with a healthy lifestyle that includes regular activity. Aim for 150 minutes of moderate exercise (such as walking) or 75 minutes of vigorous exercise (such as running) each week. Where do I find calorie information?  The number of calories in a food can be found on a Nutrition Facts label. If a food does not have a Nutrition Facts label, try to look up the calories online or ask your dietitian for help. Remember that calories are listed per serving. If you choose to have more than one serving of a food, you will have to multiply the calories per serving by the amount of servings you plan to eat. For example, the label on a package of bread might say that a serving size is 1 slice and that there are 90 calories in a serving. If you eat 1 slice, you will have eaten 90 calories. If you eat 2 slices, you will have eaten 180 calories. How do I keep a food log? Immediately after each meal, record the following information in your food log:  What you ate. Don't forget to include toppings, sauces, and other extras on the food.  How much you ate. This can be measured in cups, ounces, or number of items.  How many calories each food and drink had.  The total number of calories in the meal.  Keep your food log near you, such as in a small notebook in your pocket, or use a mobile app or website. Some programs will calculate calories for you and show you how many calories you have left for the day to meet your goal. What are some calorie counting  tips?  Use your calories on foods and drinks that will fill you up and not leave you hungry: ? Some examples of foods that fill you up are nuts and nut butters, vegetables, lean  proteins, and high-fiber foods like whole grains. High-fiber foods are foods with more than 5 g fiber per serving. ? Drinks such as sodas, specialty coffee drinks, alcohol, and juices have a lot of calories, yet do not fill you up.  Eat nutritious foods and avoid empty calories. Empty calories are calories you get from foods or beverages that do not have many vitamins or protein, such as candy, sweets, and soda. It is better to have a nutritious high-calorie food (such as an avocado) than a food with few nutrients (such as a bag of chips).  Know how many calories are in the foods you eat most often. This will help you calculate calorie counts faster.  Pay attention to calories in drinks. Low-calorie drinks include water and unsweetened drinks.  Pay attention to nutrition labels for "low fat" or "fat free" foods. These foods sometimes have the same amount of calories or more calories than the full fat versions. They also often have added sugar, starch, or salt, to make up for flavor that was removed with the fat.  Find a way of tracking calories that works for you. Get creative. Try different apps or programs if writing down calories does not work for you. What are some portion control tips?  Know how many calories are in a serving. This will help you know how many servings of a certain food you can have.  Use a measuring cup to measure serving sizes. You could also try weighing out portions on a kitchen scale. With time, you will be able to estimate serving sizes for some foods.  Take some time to put servings of different foods on your favorite plates, bowls, and cups so you know what a serving looks like.  Try not to eat straight from a bag or box. Doing this can lead to overeating. Put the amount you would like to  eat in a cup or on a plate to make sure you are eating the right portion.  Use smaller plates, glasses, and bowls to prevent overeating.  Try not to multitask (for example, watch TV or use your computer) while eating. If it is time to eat, sit down at a table and enjoy your food. This will help you to know when you are full. It will also help you to be aware of what you are eating and how much you are eating. What are tips for following this plan? Reading food labels  Check the calorie count compared to the serving size. The serving size may be smaller than what you are used to eating.  Check the source of the calories. Make sure the food you are eating is high in vitamins and protein and low in saturated and trans fats. Shopping  Read nutrition labels while you shop. This will help you make healthy decisions before you decide to purchase your food.  Make a grocery list and stick to it. Cooking  Try to cook your favorite foods in a healthier way. For example, try baking instead of frying.  Use low-fat dairy products. Meal planning  Use more fruits and vegetables. Half of your plate should be fruits and vegetables.  Include lean proteins like poultry and fish. How do I count calories when eating out?  Ask for smaller portion sizes.  Consider sharing an entree and sides instead of getting your own entree.  If you get your own entree, eat only half. Ask for a box at the beginning of your meal and put the rest  of your entree in it so you are not tempted to eat it.  If calories are listed on the menu, choose the lower calorie options.  Choose dishes that include vegetables, fruits, whole grains, low-fat dairy products, and lean protein.  Choose items that are boiled, broiled, grilled, or steamed. Stay away from items that are buttered, battered, fried, or served with cream sauce. Items labeled "crispy" are usually fried, unless stated otherwise.  Choose water, low-fat milk,  unsweetened iced tea, or other drinks without added sugar. If you want an alcoholic beverage, choose a lower calorie option such as a glass of wine or light beer.  Ask for dressings, sauces, and syrups on the side. These are usually high in calories, so you should limit the amount you eat.  If you want a salad, choose a garden salad and ask for grilled meats. Avoid extra toppings like bacon, cheese, or fried items. Ask for the dressing on the side, or ask for olive oil and vinegar or lemon to use as dressing.  Estimate how many servings of a food you are given. For example, a serving of cooked rice is  cup or about the size of half a baseball. Knowing serving sizes will help you be aware of how much food you are eating at restaurants. The list below tells you how big or small some common portion sizes are based on everyday objects: ? 1 oz-4 stacked dice. ? 3 oz-1 deck of cards. ? 1 tsp-1 die. ? 1 Tbsp- a ping-pong ball. ? 2 Tbsp-1 ping-pong ball. ?  cup- baseball. ? 1 cup-1 baseball. Summary  Calorie counting means keeping track of how many calories you eat and drink each day. If you eat fewer calories than your body needs, you should lose weight.  A healthy amount of weight to lose per week is usually 1-2 lb (0.5-0.9 kg). This usually means reducing your daily calorie intake by 500-750 calories.  The number of calories in a food can be found on a Nutrition Facts label. If a food does not have a Nutrition Facts label, try to look up the calories online or ask your dietitian for help.  Use your calories on foods and drinks that will fill you up, and not on foods and drinks that will leave you hungry.  Use smaller plates, glasses, and bowls to prevent overeating. This information is not intended to replace advice given to you by your health care provider. Make sure you discuss any questions you have with your health care provider. Document Released: 09/08/2005 Document Revised: 08/08/2016  Document Reviewed: 08/08/2016 Elsevier Interactive Patient Education  2018 [employer]  DASH Eating Plan DASH stands for "Dietary Approaches to Stop Hypertension." The DASH eating plan is a healthy eating plan that has been shown to reduce high blood pressure (hypertension). It may also reduce your risk for type 2 diabetes, heart disease, and stroke. The DASH eating plan may also help with weight loss. What are tips for following this plan? General guidelines  Avoid eating more than 2,300 mg (milligrams) of salt (sodium) a day. If you have hypertension, you may need to reduce your sodium intake to 1,500 mg a day.  Limit alcohol intake to no more than 1 drink a day for nonpregnant women and 2 drinks a day for men. One drink equals 12 oz of beer, 5 oz of wine, or 1 oz of hard liquor.  Work with your health care provider to maintain a healthy body weight or to  lose weight. Ask what an ideal weight is for you.  Get at least 30 minutes of exercise that causes your heart to beat faster (aerobic exercise) most days of the week. Activities may include walking, swimming, or biking.  Work with your health care provider or diet and nutrition specialist (dietitian) to adjust your eating plan to your individual calorie needs. Reading food labels  Check food labels for the amount of sodium per serving. Choose foods with less than 5 percent of the Daily Value of sodium. Generally, foods with less than 300 mg of sodium per serving fit into this eating plan.  To find whole grains, look for the word "whole" as the first word in the ingredient list. Shopping  Buy products labeled as "low-sodium" or "no salt added."  Buy fresh foods. Avoid canned foods and premade or frozen meals. Cooking  Avoid adding salt when cooking. Use salt-free seasonings or herbs instead of table salt or sea salt. Check with your health care provider or pharmacist before using salt substitutes.  Do not fry foods. Cook foods  using healthy methods such as baking, boiling, grilling, and broiling instead.  Cook with heart-healthy oils, such as olive, canola, soybean, or sunflower oil. Meal planning   Eat a balanced diet that includes: ? 5 or more servings of fruits and vegetables each day. At each meal, try to fill half of your plate with fruits and vegetables. ? Up to 6-8 servings of whole grains each day. ? Less than 6 oz of lean meat, poultry, or fish each day. A 3-oz serving of meat is about the same size as a deck of cards. One egg equals 1 oz. ? 2 servings of low-fat dairy each day. ? A serving of nuts, seeds, or beans 5 times each week. ? Heart-healthy fats. Healthy fats called Omega-3 fatty acids are found in foods such as flaxseeds and coldwater fish, like sardines, salmon, and mackerel.  Limit how much you eat of the following: ? Canned or prepackaged foods. ? Food that is high in trans fat, such as fried foods. ? Food that is high in saturated fat, such as fatty meat. ? Sweets, desserts, sugary drinks, and other foods with added sugar. ? Full-fat dairy products.  Do not salt foods before eating.  Try to eat at least 2 vegetarian meals each week.  Eat more home-cooked food and less restaurant, buffet, and fast food.  When eating at a restaurant, ask that your food be prepared with less salt or no salt, if possible. What foods are recommended? The items listed may not be a complete list. Talk with your dietitian about what dietary choices are best for you. Grains Whole-grain or whole-wheat bread. Whole-grain or whole-wheat pasta. Brown rice. Orpah Cobb. Bulgur. Whole-grain and low-sodium cereals. Pita bread. Low-fat, low-sodium crackers. Whole-wheat flour tortillas. Vegetables Fresh or frozen vegetables (raw, steamed, roasted, or grilled). Low-sodium or reduced-sodium tomato and vegetable juice. Low-sodium or reduced-sodium tomato sauce and tomato paste. Low-sodium or reduced-sodium canned  vegetables. Fruits All fresh, dried, or frozen fruit. Canned fruit in natural juice (without added sugar). Meat and other protein foods Skinless chicken or [place]. Ground chicken or [place]. Pork with fat trimmed off. Fish and seafood. Egg whites. Dried beans, peas, or lentils. Unsalted nuts, nut butters, and seeds. Unsalted canned beans. Lean cuts of beef with fat trimmed off. Low-sodium, lean deli meat. Dairy Low-fat (1%) or fat-free (skim) milk. Fat-free, low-fat, or reduced-fat cheeses. Nonfat, low-sodium ricotta or cottage cheese. Low-fat or  nonfat yogurt. Low-fat, low-sodium cheese. Fats and oils Soft margarine without trans fats. Vegetable oil. Low-fat, reduced-fat, or light mayonnaise and salad dressings (reduced-sodium). Canola, safflower, olive, soybean, and sunflower oils. Avocado. Seasoning and other foods Herbs. Spices. Seasoning mixes without salt. Unsalted popcorn and pretzels. Fat-free sweets. What foods are not recommended? The items listed may not be a complete list. Talk with your dietitian about what dietary choices are best for you. Grains Baked goods made with fat, such as croissants, muffins, or some breads. Dry pasta or rice meal packs. Vegetables Creamed or fried vegetables. Vegetables in a cheese sauce. Regular canned vegetables (not low-sodium or reduced-sodium). Regular canned tomato sauce and paste (not low-sodium or reduced-sodium). Regular tomato and vegetable juice (not low-sodium or reduced-sodium). Rosita Fire. Olives. Fruits Canned fruit in a light or heavy syrup. Fried fruit. Fruit in cream or butter sauce. Meat and other protein foods Fatty cuts of meat. Ribs. Fried meat. Tomasa Blase. Sausage. Bologna and other processed lunch meats. Salami. Fatback. Hotdogs. Bratwurst. Salted nuts and seeds. Canned beans with added salt. Canned or smoked fish. Whole eggs or egg yolks. Chicken or [place] with skin. Dairy Whole or 2% milk, cream, and half-and-half. Whole or full-fat  cream cheese. Whole-fat or sweetened yogurt. Full-fat cheese. Nondairy creamers. Whipped toppings. Processed cheese and cheese spreads. Fats and oils Butter. Stick margarine. Lard. Shortening. Ghee. Bacon fat. Tropical oils, such as coconut, palm kernel, or palm oil. Seasoning and other foods Salted popcorn and pretzels. Onion salt, garlic salt, seasoned salt, table salt, and sea salt. Worcestershire sauce. Tartar sauce. Barbecue sauce. Teriyaki sauce. Soy sauce, including reduced-sodium. Steak sauce. Canned and packaged gravies. Fish sauce. Oyster sauce. Cocktail sauce. Horseradish that you find on the shelf. Ketchup. Mustard. Meat flavorings and tenderizers. Bouillon cubes. Hot sauce and Tabasco sauce. Premade or packaged marinades. Premade or packaged taco seasonings. Relishes. Regular salad dressings. Where to find more information:  National Heart, Lung, and Blood Institute: PopSteam.is  American Heart Association: www.heart.org Summary  The DASH eating plan is a healthy eating plan that has been shown to reduce high blood pressure (hypertension). It may also reduce your risk for type 2 diabetes, heart disease, and stroke.  With the DASH eating plan, you should limit salt (sodium) intake to 2,300 mg a day. If you have hypertension, you may need to reduce your sodium intake to 1,500 mg a day.  When on the DASH eating plan, aim to eat more fresh fruits and vegetables, whole grains, lean proteins, low-fat dairy, and heart-healthy fats.  Work with your health care provider or diet and nutrition specialist (dietitian) to adjust your eating plan to your individual calorie needs. This information is not intended to replace advice given to you by your health care provider. Make sure you discuss any questions you have with your health care provider. Document Released: 08/28/2011 Document Revised: 09/01/2016 Document Reviewed: 09/01/2016 Elsevier Interactive Patient Education  [employer].

## 2018-09-03 NOTE — Progress Notes (Signed)
Subjective:    Patient ID: Andre Hall, male    DOB: 1986/06/27, 32 y.o.   MRN: [id number]  Chief Complaint:  Annual Exam and Cough, chest congestion, low grade fever (using Mucinex)   HPI: Andre Hall is a 32 y.o. male presenting on 09/03/2018 for Annual Exam and Cough, chest congestion, low grade fever (using Mucinex)  Pt presents today for his annual physical exam. Pt states he has been watching his diet and exercising more. States he runs for 30 minutes every evening. States he has been eating baked and grilled chicken with vegetables. States he is drinking more water and cutting out soft drinks. States he does dip, states he switched from smoking to dipping. States he is trying to quit.  He has a history of GERD. He is taking omeprazole daily with great control of symptoms. States he does not have break through heartburn while taking the omeprazole. Denies adverse side effects from the medications. Pt reports he has had cough, congestion, fever, chills, headache, and sinus pressure for 7 days. States he has tried over the counter Mucinex and sinus medications without relief of symptoms. States the symptoms are getting worse and he now feels fatigued.  Pt also reports an increase is anxiety and worrying. States this is worse at night when he tries to go to sleep. States he is unable to turn his thoughts off to go to sleep. States he had an episode of panic over 4 months ago and was seen in the ED at [facility]. States he had a negative workup at the ED and was told his symptoms were due to anxiety. He states during this episode he had palpitations, feelings of impending doom, and shortness of breath. States he has not had another panic attack but continues to have anxiety and worrying. States this is on a daily basis and is causing sleep disturbances. He has not taken anything in the past for anxiety and is unsure if he has a family history of anxiety.  GAD 7 : Generalized Anxiety  Score 09/03/2018  Nervous, Anxious, on Edge 3  Control/stop worrying 3  Worry too much - different things 3  Trouble relaxing 3  Restless 0  Easily annoyed or irritable 1  Afraid - awful might happen 3  Total GAD 7 Score 16  Anxiety Difficulty Somewhat difficult      Relevant past medical, surgical, family, and social history reviewed and updated as indicated.  Allergies and medications reviewed and updated.   Past Medical History:  Diagnosis Date  . Elevated triglycerides with high cholesterol 03/05/2015  . Generalized anxiety disorder 09/03/2018  . GERD (gastroesophageal reflux disease)     Past Surgical History:  Procedure Laterality Date  . HERNIA REPAIR     2006/2007    Social History   Socioeconomic History  . Marital status: Married    Spouse name: Not on file  . Number of children: Not on file  . Years of education: Not on file  . Highest education level: Not on file  Occupational History  . Not on file  Social Needs  . Financial resource strain: Not on file  . Food insecurity:    Worry: Not on file    Inability: Not on file  . Transportation needs:    Medical: Not on file    Non-medical: Not on file  Tobacco Use  . Smoking status: Current Every Day Smoker    Packs/day: 1.00    Types:  Cigarettes  . Smokeless tobacco: Never Used  Substance and Sexual Activity  . Alcohol use: Yes    Alcohol/week: 6.0 standard drinks    Types: 6 Standard drinks or equivalent per week  . Drug use: No  . Sexual activity: Not on file  Lifestyle  . Physical activity:    Days per week: Not on file    Minutes per session: Not on file  . Stress: Not on file  Relationships  . Social connections:    Talks on phone: Not on file    Gets together: Not on file    Attends religious service: Not on file    Active member of club or organization: Not on file    Attends meetings of clubs or organizations: Not on file    Relationship status: Not on file  . Intimate partner  violence:    Fear of current or ex partner: Not on file    Emotionally abused: Not on file    Physically abused: Not on file    Forced sexual activity: Not on file  Other Topics Concern  . Not on file  Social History Narrative  . Not on file    Outpatient Encounter Medications as of 09/03/2018  Medication Sig  . [DISCONTINUED] omeprazole (PRILOSEC) 20 MG capsule TAKE 1 CAPSULE (20 MG TOTAL) BY MOUTH DAILY.  Marland Kitchen amoxicillin-clavulanate (AUGMENTIN) 875-125 MG tablet Take 1 tablet by mouth 2 (two) times daily for 7 days.  . fluticasone (FLONASE) 50 MCG/ACT nasal spray Place 2 sprays into both nostrils daily.  . multivitamin (ONE-A-DAY MEN'S) TABS tablet Take 1 tablet by mouth daily.  Marland Kitchen nystatin cream (MYCOSTATIN) Apply 1 application topically 2 (two) times daily.  . Omega-3 Fatty Acids (FISH OIL) 1000 MG CAPS Take 2 capsules by mouth 2 (two) times daily.  Marland Kitchen omeprazole (PRILOSEC) 20 MG capsule Take 1 capsule (20 mg total) by mouth daily.  . sertraline (ZOLOFT) 50 MG tablet Take 1 tablet (50 mg total) by mouth daily.  . [DISCONTINUED] cyclobenzaprine (FLEXERIL) 5 MG tablet Take 1 tablet (5 mg total) by mouth 3 (three) times daily as needed for muscle spasms.  . [DISCONTINUED] pantoprazole (PROTONIX) 40 MG tablet Take 1 tablet (40 mg total) by mouth daily.   No facility-administered encounter medications on file as of 09/03/2018.     No Known Allergies  Review of Systems  Constitutional: Positive for chills, fatigue and fever. Negative for unexpected weight change.  HENT: Positive for congestion, postnasal drip, rhinorrhea, sinus pressure, sinus pain and sore throat. Negative for trouble swallowing.   Eyes: Negative for photophobia and visual disturbance.  Respiratory: Positive for cough. Negative for shortness of breath and wheezing.   Cardiovascular: Negative for chest pain, palpitations and leg swelling.  Gastrointestinal: Negative for abdominal pain, constipation, diarrhea, nausea and  vomiting.  Genitourinary: Negative for difficulty urinating, discharge, penile pain, penile swelling, scrotal swelling, testicular pain and urgency.  Musculoskeletal: Negative for myalgias, neck pain and neck stiffness.  Skin: Negative.   Neurological: Positive for headaches. Negative for dizziness, tremors, weakness, light-headedness and numbness.  Psychiatric/Behavioral: Positive for decreased concentration and sleep disturbance. Negative for agitation and suicidal ideas. The patient is nervous/anxious.   All other systems reviewed and are negative.       Objective:    BP (!) 142/88 (BP Location: Right Arm, Cuff Size: Normal)   Pulse 72   Temp 99.9 F (37.7 C) (Oral)   Ht 5' 10"  (1.778 m)   Wt 213 lb (96.6 kg)  BMI 30.56 kg/m    Wt Readings from Last 3 Encounters:  09/03/18 213 lb (96.6 kg)  03/30/18 213 lb (96.6 kg)  02/05/16 213 lb 6.4 oz (96.8 kg)    Physical Exam Constitutional:      Appearance: Normal appearance. He is well-developed, well-groomed and overweight.  HENT:     Head: Normocephalic and atraumatic.     Jaw: There is normal jaw occlusion.     Right Ear: Hearing, tympanic membrane, ear canal and external ear normal.     Left Ear: Hearing, tympanic membrane, ear canal and external ear normal.     Nose: Mucosal edema and rhinorrhea present. Rhinorrhea is purulent.     Right Turbinates: Swollen.     Left Turbinates: Swollen.     Right Sinus: Maxillary sinus tenderness present. No frontal sinus tenderness.     Left Sinus: Maxillary sinus tenderness present. No frontal sinus tenderness.     Mouth/Throat:     Lips: Pink.     Mouth: Mucous membranes are moist.     Pharynx: Posterior oropharyngeal erythema present. No pharyngeal swelling, oropharyngeal exudate or uvula swelling.     Tonsils: No tonsillar exudate or tonsillar abscesses.  Eyes:     Extraocular Movements: Extraocular movements intact.     Conjunctiva/sclera: Conjunctivae normal.     Pupils:  Pupils are equal, round, and reactive to light.  Neck:     Musculoskeletal: Full passive range of motion without pain and neck supple.     Thyroid: No thyroid mass, thyromegaly or thyroid tenderness.     Vascular: No carotid bruit or JVD.     Trachea: Trachea and phonation normal.  Cardiovascular:     Rate and Rhythm: Normal rate and regular rhythm.     Pulses: Normal pulses.     Heart sounds: No murmur. No friction rub. No gallop.   Pulmonary:     Effort: Pulmonary effort is normal.     Breath sounds: Normal breath sounds.  Abdominal:     General: Bowel sounds are normal. There is no distension or abdominal bruit.     Palpations: Abdomen is soft. There is no hepatomegaly or splenomegaly.     Tenderness: There is no abdominal tenderness. There is no right CVA tenderness or left CVA tenderness.  Musculoskeletal: Normal range of motion.  Lymphadenopathy:     Cervical: No cervical adenopathy.  Skin:    General: Skin is warm and dry.     Capillary Refill: Capillary refill takes less than 2 seconds.  Neurological:     General: No focal deficit present.     Mental Status: He is alert and oriented to person, place, and time.     Cranial Nerves: Cranial nerves are intact.     Sensory: Sensation is intact.     Motor: Motor function is intact.     Coordination: Coordination is intact.     Gait: Gait is intact.     Deep Tendon Reflexes: Reflexes are normal and symmetric.  Psychiatric:        Attention and Perception: Attention and perception normal.        Mood and Affect: Mood is anxious.        Speech: Speech normal.        Behavior: Behavior is cooperative.        Thought Content: Thought content normal. Thought content does not include homicidal or suicidal ideation. Thought content does not include homicidal or suicidal plan.  Cognition and Memory: Cognition and memory normal.        Judgment: Judgment normal.     Results for orders placed or performed in visit on 02/05/16    Lipid Panel  Result Value Ref Range   Cholesterol, Total 213 (H) 100 - 199 mg/dL   Triglycerides 1,853 (HH) 0 - 149 mg/dL   HDL 13 (L) >39 mg/dL   VLDL Cholesterol Cal Comment 5 - 40 mg/dL   LDL Calculated Comment 0 - 99 mg/dL   Chol/HDL Ratio 16.4 (H) 0.0 - 5.0 ratio units  CMP14+EGFR  Result Value Ref Range   Glucose 114 (H) 65 - 99 mg/dL   BUN 16 6 - 20 mg/dL   Creatinine, Ser 0.97 0.76 - 1.27 mg/dL   GFR calc non Af Amer 106 >59 mL/min/1.73   GFR calc Af Amer 122 >59 mL/min/1.73   BUN/Creatinine Ratio 16 9 - 20   Sodium 141 134 - 144 mmol/L   Potassium 4.4 3.5 - 5.2 mmol/L   Chloride 99 96 - 106 mmol/L   CO2 20 18 - 29 mmol/L   Calcium 9.3 8.7 - 10.2 mg/dL   Total Protein 6.8 6.0 - 8.5 g/dL   Albumin 4.6 3.5 - 5.5 g/dL   Globulin, Total 2.2 1.5 - 4.5 g/dL   Albumin/Globulin Ratio 2.1 1.2 - 2.2   Bilirubin Total 0.4 0.0 - 1.2 mg/dL   Alkaline Phosphatase 84 39 - 117 IU/L   AST 23 0 - 40 IU/L   ALT 31 0 - 44 IU/L  CBC with Differential  Result Value Ref Range   WBC 4.7 3.4 - 10.8 x10E3/uL   RBC 5.33 4.14 - 5.80 x10E6/uL   Hemoglobin 16.2 12.6 - 17.7 g/dL   Hematocrit 46.2 37.5 - 51.0 %   MCV 87 79 - 97 fL   MCH 30.4 26.6 - 33.0 pg   MCHC 35.1 31.5 - 35.7 g/dL   RDW 13.4 12.3 - 15.4 %   Platelets 193 150 - 379 x10E3/uL   Neutrophils 49 %   Lymphs 43 %   Monocytes 6 %   Eos 2 %   Basos 0 %   Neutrophils Absolute 2.3 1.4 - 7.0 x10E3/uL   Lymphocytes Absolute 2.0 0.7 - 3.1 x10E3/uL   Monocytes Absolute 0.3 0.1 - 0.9 x10E3/uL   EOS (ABSOLUTE) 0.1 0.0 - 0.4 x10E3/uL   Basophils Absolute 0.0 0.0 - 0.2 x10E3/uL   Immature Granulocytes 0 %   Immature Grans (Abs) 0.0 0.0 - 0.1 x10E3/uL  TSH  Result Value Ref Range   TSH 2.250 0.450 - 4.500 uIU/mL  VITAMIN D 25 Hydroxy (Vit-D Deficiency, Fractures)  Result Value Ref Range   Vit D, 25-Hydroxy 15.5 (L) 30.0 - 100.0 ng/mL  Uric acid  Result Value Ref Range   Uric Acid 9.8 (H) 3.7 - 8.6 mg/dL  Hgb A1c w/o eAG   Result Value Ref Range   Hgb A1c MFr Bld 5.5 4.8 - 5.6 %  Specimen status report  Result Value Ref Range   specimen status report Comment        Pertinent labs & imaging results that were available during my care of the patient were reviewed by me and considered in my medical decision making.  Assessment & Plan:  Jadarius was seen today for annual exam and cough, chest congestion, low grade fever.  Diagnoses and all orders for this visit:  Routine general medical examination at a health care facility -  CBC with Differential -     Comprehensive metabolic panel -     TSH -     Lipid panel -     HIV antibody -     Tdap vaccine greater than or equal to 7yo IM  Mixed hyperlipidemia Diet and exercise encouraged. Pt is taking over the counter fish oil daily. Will recheck lipid paned today.       -    Lipid panel  Generalized anxiety disorder -     sertraline (ZOLOFT) 50 MG tablet; Take 1 tablet (50 mg total) by mouth daily.  Vitamin D deficiency Taking multivitamin daily. Will recheck level today. -     VITAMIN D 25 Hydroxy (Vit-D Deficiency, Fractures)  Gastroesophageal reflux disease without esophagitis Avoid spicy and greasy foods. Avoid alcohol, caffeine, and tobacco. Avoid meals for at least 45 minutes before bed. Medications as prescribed.  -     omeprazole (PRILOSEC) 20 MG capsule; Take 1 capsule (20 mg total) by mouth daily.  Acute non-recurrent maxillary sinusitis Increase fluid intake. Avoid cigarette smoke. Frequent saline nasal sprays. Medications as prescribed.  -     amoxicillin-clavulanate (AUGMENTIN) 875-125 MG tablet; Take 1 tablet by mouth 2 (two) times daily for 7 days. -     fluticasone (FLONASE) 50 MCG/ACT nasal spray; Place 2 sprays into both nostrils daily.  BMI 30.0-30.9,adult Die and exercise encouraged.   Elevated blood pressure reading Pt anxious in office today. Has been using over the counter cold and cough medications. Lifestyle modifications  discussed. Pt will keep a log of blood pressures at home and bring to next visit. Pt aware of signs and symptoms to report.   Tobacco dipper Pt attempting to quit. Pt aware we will address this further once we get his anxiety under control.     Continue all other maintenance medications.  Follow up plan: Return in 4 weeks (on 10/01/2018), or if symptoms worsen or fail to improve.  Educational handout given for DASH diet, health maintenance   The above assessment and management plan was discussed with the patient. The patient verbalized understanding of and has agreed to the management plan. Patient is aware to call the clinic if symptoms persist or worsen. Patient is aware when to return to the clinic for a follow-up visit. Patient educated on when it is appropriate to go to the emergency department.   Monia Pouch, FNP-C [facility] [phone]

## 2018-09-06 ENCOUNTER — Telehealth: Payer: Self-pay | Admitting: Family Medicine

## 2018-09-06 NOTE — Telephone Encounter (Signed)
Pt recently had labs done and wants to talk to the dr about his cholesterol

## 2018-09-06 NOTE — Telephone Encounter (Signed)
Reviewed cholesterol with pt.

## 2018-09-07 LAB — CBC WITH DIFFERENTIAL/PLATELET
Basophils Absolute: 0 10*3/uL (ref 0.0–0.2)
Basos: 1 %
EOS (ABSOLUTE): 0.1 10*3/uL (ref 0.0–0.4)
EOS: 2 %
HEMATOCRIT: 46.3 % (ref 37.5–51.0)
Hemoglobin: 16.2 g/dL (ref 13.0–17.7)
Immature Grans (Abs): 0 10*3/uL (ref 0.0–0.1)
Immature Granulocytes: 0 %
Lymphocytes Absolute: 1.2 10*3/uL (ref 0.7–3.1)
Lymphs: 35 %
MCH: 29.6 pg (ref 26.6–33.0)
MCHC: 35 g/dL (ref 31.5–35.7)
MCV: 85 fL (ref 79–97)
MONOS ABS: 0.4 10*3/uL (ref 0.1–0.9)
Monocytes: 10 %
Neutrophils Absolute: 1.7 10*3/uL (ref 1.4–7.0)
Neutrophils: 52 %
Platelets: 157 10*3/uL (ref 150–450)
RBC: 5.47 x10E6/uL (ref 4.14–5.80)
RDW: 12.2 % — AB (ref 12.3–15.4)
WBC: 3.4 10*3/uL (ref 3.4–10.8)

## 2018-09-07 LAB — LIPID PANEL
CHOLESTEROL TOTAL: 148 mg/dL (ref 100–199)
Chol/HDL Ratio: 5.7 ratio — ABNORMAL HIGH (ref 0.0–5.0)
HDL: 26 mg/dL — ABNORMAL LOW (ref >39)
LDL CALC: 101 mg/dL — AB (ref 0–99)
Triglycerides: 105 mg/dL (ref 0–149)
VLDL Cholesterol Cal: 21 mg/dL (ref 5–40)

## 2018-09-07 LAB — TSH: TSH: 1.54 u[IU]/mL (ref 0.450–4.500)

## 2018-09-07 LAB — COMPREHENSIVE METABOLIC PANEL
A/G RATIO: 2 (ref 1.2–2.2)
ALT: 28 IU/L (ref 0–44)
AST: 29 IU/L (ref 0–40)
Albumin: 4.8 g/dL (ref 3.5–5.5)
Alkaline Phosphatase: 56 IU/L (ref 39–117)
BUN/Creatinine Ratio: 13 (ref 9–20)
BUN: 11 mg/dL (ref 6–20)
Bilirubin Total: 0.5 mg/dL (ref 0.0–1.2)
CO2: 24 mmol/L (ref 20–29)
Calcium: 9.3 mg/dL (ref 8.7–10.2)
Chloride: 98 mmol/L (ref 96–106)
Creatinine, Ser: 0.86 mg/dL (ref 0.76–1.27)
GFR calc Af Amer: 135 mL/min/{1.73_m2} (ref >59)
GFR, EST NON AFRICAN AMERICAN: 116 mL/min/{1.73_m2} (ref >59)
GLOBULIN, TOTAL: 2.4 g/dL (ref 1.5–4.5)
Glucose: 80 mg/dL (ref 65–99)
POTASSIUM: 4 mmol/L (ref 3.5–5.2)
SODIUM: 139 mmol/L (ref 134–144)
Total Protein: 7.2 g/dL (ref 6.0–8.5)

## 2018-09-07 LAB — VITAMIN D 25 HYDROXY (VIT D DEFICIENCY, FRACTURES): VIT D 25 HYDROXY: 28.7 ng/mL — AB (ref 30.0–100.0)

## 2018-09-07 LAB — HIV ANTIBODY (ROUTINE TESTING W REFLEX): HIV SCREEN 4TH GENERATION: NONREACTIVE

## 2018-10-07 ENCOUNTER — Ambulatory Visit: Payer: BLUE CROSS/BLUE SHIELD | Admitting: Pediatrics

## 2018-10-07 ENCOUNTER — Encounter: Payer: Self-pay | Admitting: Pediatrics

## 2018-10-07 VITALS — BP 138/77 | HR 73 | Temp 97.1°F | Ht 70.0 in | Wt 213.2 lb

## 2018-10-07 DIAGNOSIS — M109 Gout, unspecified: Secondary | ICD-10-CM | POA: Diagnosis not present

## 2018-10-07 MED ORDER — NAPROXEN 500 MG PO TBEC: 500.0000 mg | DELAYED_RELEASE_TABLET | Freq: Two times a day (BID) | ORAL | 1 refills | Status: DC

## 2018-10-07 NOTE — Patient Instructions (Signed)
Low-Purine Eating Plan  A low-purine eating plan involves making food choices to limit your intake of purine. Purine is a kind of uric acid. Too much uric acid in your blood can cause certain conditions, such as gout and kidney stones. Eating a low-purine diet can help control these conditions.  What are tips for following this plan?  Reading food labels     Avoid foods with saturated or Trans fat.   Check the ingredient list of grains-based foods, such as bread and cereal, to make sure that they contain whole grains.   Check the ingredient list of sauces or soups to make sure they do not contain meat or fish.   When choosing soft drinks, check the ingredient list to make sure they do not contain high-fructose corn syrup.  Shopping   Buy plenty of fresh fruits and vegetables.   Avoid buying canned or fresh fish.   Buy dairy products labeled as low-fat or nonfat.   Avoid buying premade or processed foods. These foods are often high in fat, salt (sodium), and added sugar.  Cooking   Use olive oil instead of butter when cooking. Oils like olive oil, canola oil, and sunflower oil contain healthy fats.  Meal planning   Learn which foods do or do not affect you. If you find out that a food tends to cause your gout symptoms to flare up, avoid eating that food. You can enjoy foods that do not cause problems. If you have any questions about a food item, talk with your dietitian or health care provider.   Limit foods high in fat, especially saturated fat. Fat makes it harder for your body to get rid of uric acid.   Choose foods that are lower in fat and are lean sources of protein.  General guidelines   Limit alcohol intake to no more than 1 drink a day for nonpregnant women and 2 drinks a day for men. One drink equals 12 oz of beer, 5 oz of wine, or 1 oz of hard liquor. Alcohol can affect the way your body gets rid of uric acid.   Drink plenty of water to keep your urine clear or pale yellow. Fluids can help  remove uric acid from your body.   If directed by your health care provider, take a vitamin C supplement.   Work with your health care provider and dietitian to develop a plan to achieve or maintain a healthy weight. Losing weight can help reduce uric acid in your blood.  What foods are recommended?  The items listed may not be a complete list. Talk with your dietitian about what dietary choices are best for you.  Foods low in purines  Foods low in purines do not need to be limited. These include:   All fruits.   All low-purine vegetables, pickles, and olives.   Breads, pasta, rice, cornbread, and popcorn. Cake and other baked goods.   All dairy foods.   Eggs, nuts, and nut butters.   Spices and condiments, such as salt, herbs, and vinegar.   Plant oils, butter, and margarine.   Water, sugar-free soft drinks, tea, coffee, and cocoa.   Vegetable-based soups, broths, sauces, and gravies.  Foods moderate in purines  Foods moderate in purines should be limited to the amounts listed.    cup of asparagus, cauliflower, spinach, mushrooms, or green peas, each day.   2/3 cup uncooked oatmeal, each day.    cup dry wheat bran or wheat germ, each day.     2-3 ounces of meat or poultry, each day.   4-6 ounces of shellfish, such as crab, lobster, oysters, or shrimp, each day.   1 cup cooked beans, peas, or lentils, each day.   Soup, broths, or bouillon made from meat or fish. Limit these foods as much as possible.  What foods are not recommended?  The items listed may not be a complete list. Talk with your dietitian about what dietary choices are best for you.  Limit your intake of foods high in purines, including:   Beer and other alcohol.   Meat-based gravy or sauce.   Canned or fresh fish, such as:  ? Anchovies, sardines, herring, and tuna.  ? Mussels and scallops.  ? Codfish, trout, and haddock.   Bacon.   Organ meats, such as:  ? Liver or kidney.  ? Tripe.  ? Sweetbreads (thymus gland or  pancreas).   Wild game or goose.   Yeast or yeast extract supplements.   Drinks sweetened with high-fructose corn syrup.  Summary   Eating a low-purine diet can help control conditions caused by too much uric acid in the body, such as gout or kidney stones.   Choose low-purine foods, limit alcohol, and limit foods high in fat.   You will learn over time which foods do or do not affect you. If you find out that a food tends to cause your gout symptoms to flare up, avoid eating that food.  This information is not intended to replace advice given to you by your health care provider. Make sure you discuss any questions you have with your health care provider.  Document Released: 01/03/2011 Document Revised: 10/22/2016 Document Reviewed: 10/22/2016  Elsevier Interactive Patient Education  2019 [employer]      Gout    Gout is painful swelling of your joints. Gout is a type of arthritis. It is caused by having too much uric acid in your body. Uric acid is a chemical that is made when your body breaks down substances called purines. If your body has too much uric acid, sharp crystals can form and build up in your joints. This causes pain and swelling.  Gout attacks can happen quickly and be very painful (acute gout). Over time, the attacks can affect more joints and happen more often (chronic gout).  What are the causes?   Too much uric acid in your blood. This can happen because:  ? Your kidneys do not remove enough uric acid from your blood.  ? Your body makes too much uric acid.  ? You eat too many foods that are high in purines. These foods include organ meats, some seafood, and beer.   Trauma or stress.  What increases the risk?   Having a family history of gout.   Being male and middle-aged.   Being male and having gone through menopause.   Being very overweight (obese).   Drinking alcohol, especially beer.   Not having enough water in the body (being dehydrated).   Losing weight too  quickly.   Having an organ transplant.   Having lead poisoning.   Taking certain medicines.   Having kidney disease.   Having a skin condition called psoriasis.  What are the signs or symptoms?  An attack of acute gout usually happens in just one joint. The most common place is the big toe. Attacks often start at night. Other joints that may be affected include joints of the feet, ankle, knee, fingers, wrist, or elbow. Symptoms   of an attack may include:   Very bad pain.   Warmth.   Swelling.   Stiffness.   Shiny, red, or purple skin.   Tenderness. The affected joint may be very painful to touch.   Chills and fever.  Chronic gout may cause symptoms more often. More joints may be involved. You may also have white or yellow lumps (tophi) on your hands or feet or in other areas near your joints.  How is this treated?   Treatment for this condition has two phases: treating an acute attack and preventing future attacks.   Acute gout treatment may include:  ? NSAIDs.  ? Steroids. These are taken by mouth or injected into a joint.  ? Colchicine. This medicine relieves pain and swelling. It can be given by mouth or through an IV tube.   Preventive treatment may include:  ? Taking small doses of NSAIDs or colchicine daily.  ? Using a medicine that reduces uric acid levels in your blood.  ? Making changes to your diet. You may need to see a food expert (dietitian) about what to eat and drink to prevent gout.  Follow these instructions at home:  During a gout attack     If told, put ice on the painful area:  ? Put ice in a plastic bag.  ? Place a towel between your skin and the bag.  ? Leave the ice on for 20 minutes, 2-3 times a day.   Raise (elevate) the painful joint above the level of your heart as often as you can.   Rest the joint as much as possible. If the joint is in your leg, you may be given crutches.   Follow instructions from your doctor about what you cannot eat or drink.  Avoiding future gout  attacks   Eat a low-purine diet. Avoid foods and drinks such as:  ? Liver.  ? Kidney.  ? Anchovies.  ? Asparagus.  ? Herring.  ? Mushrooms.  ? Mussels.  ? Beer.   Stay at a healthy weight. If you want to lose weight, talk with your doctor. Do not lose weight too fast.   Start or continue an exercise plan as told by your doctor.  Eating and drinking   Drink enough fluids to keep your pee (urine) pale yellow.   If you drink alcohol:  ? Limit how much you use to:   0-1 drink a day for women.   0-2 drinks a day for men.  ? Be aware of how much alcohol is in your drink. In the [place] one drink equals one 12 oz bottle of beer (355 mL), one 5 oz glass of wine (148 mL), or one 1 oz glass of hard liquor (44 mL).  General instructions   Take over-the-counter and prescription medicines only as told by your doctor.   Do not drive or use heavy machinery while taking prescription pain medicine.   Return to your normal activities as told by your doctor. Ask your doctor what activities are safe for you.   Keep all follow-up visits as told by your doctor. This is important.  Contact a doctor if:   You have another gout attack.   You still have symptoms of a gout attack after 10 days of treatment.   You have problems (side effects) because of your medicines.   You have chills or a fever.   You have burning pain when you pee (urinate).   You have pain in your   lower back or belly.  Get help right away if:   You have very bad pain.   Your pain cannot be controlled.   You cannot pee.  Summary   Gout is painful swelling of the joints.   The most common site of pain is the big toe, but it can affect other joints.   Medicines and avoiding some foods can help to prevent and treat gout attacks.  This information is not intended to replace advice given to you by your health care provider. Make sure you discuss any questions you have with your health care provider.  Document Released: 06/17/2008 Document Revised:  03/31/2018 Document Reviewed: 03/31/2018  Elsevier Interactive Patient Education  2019 [employer]

## 2018-10-07 NOTE — Progress Notes (Signed)
  Subjective:   Patient ID: Andre Hall, male    DOB: April 29, 1986, 33 y.o.   MRN: [id number] CC: Foot Pain (pt here today c/o left foot pain and swelling without known injury)  HPI: Andre Hall is a 33 y.o. male   Woke up yesterday morning with left first MTP joint swollen and red.  Got slightly worse today.  Wearing his boot today at work made the pain worse, he had to leave work.  Has not taken anything for the pain.  No history of gout that he knows of.  He has had uric acid levels checked in the past and they have been elevated.  No family history of gout.  By changing his diet, eating mostly plants, chicken since 2017, he was able to greatly improve his cholesterol numbers on recent lab check mid December.  He says over the holidays he stopped eating lean meats, was eating more red meat not eating as many plants, drinking sodas again.  He had [place] food the night before he woke up with the foot pain.  Blood pressure slightly elevated.  He is not interested in starting medicine right now.  He wants to get back to his diet changes he was making before.  Drinks 3-4 drinks of liquor week and days.  Not every day.  Relevant past medical, surgical, family and social history reviewed. Allergies and medications reviewed and updated. Social History   Tobacco Use  Smoking Status Current Every Day Smoker  . Packs/day: 1.00  . Types: Cigarettes  Smokeless Tobacco Never Used   ROS: Per HPI   Objective:    BP 138/77   Pulse 73   Temp (!) 97.1 F (36.2 C) (Oral)   Ht 5\' 10"  (1.778 m)   Wt 213 lb 4 oz (96.7 kg)   BMI 30.60 kg/m   Wt Readings from Last 3 Encounters:  10/07/18 213 lb 4 oz (96.7 kg)  09/03/18 213 lb (96.6 kg)  03/30/18 213 lb (96.6 kg)    Gen: NAD, alert, cooperative with exam, NCAT EYES: EOMI, no conjunctival injection, or no icterus ENT:  TMs pearly gray b/l, OP without erythema LYMPH: no cervical LAD CV: NRRR, normal S1/S2, no murmur, distal pulses 2+  b/l Resp: CTABL, no wheezes, normal WOB Neuro: Alert and oriented, strength equal b/l UE and LE, coordination grossly normal MSK: Left first MTP joint swollen, red.  Some decreased range of motion left great toe. Skin: No breaks in the skin.  Some redness left foot  Assessment & Plan:  Andre Hall was seen today for foot pain.  Diagnoses and all orders for this visit:  Acute gout of left foot, unspecified cause First flare.  Treat with NSAIDs.  Gave information for low purine diet.  If not improving let us know. -     naproxen (EC NAPROSYN) 500 MG EC tablet; Take 1 tablet (500 mg total) by mouth 2 (two) times daily with a meal.  Elevated blood pressure Blood pressure slightly elevated.  He is not interested in starting medicine right now.  He wants to get back to his diet changes he was making before.  Follow up plan: Return if symptoms worsen or fail to improve. Rex Kras, MD [place] [facility]

## 2018-10-11 ENCOUNTER — Telehealth: Payer: Self-pay | Admitting: Pediatrics

## 2018-10-11 DIAGNOSIS — M109 Gout, unspecified: Secondary | ICD-10-CM

## 2018-10-11 MED ORDER — PREDNISONE 10 MG (21) PO TBPK: ORAL_TABLET | Freq: Every day | ORAL | 0 refills | Status: DC

## 2018-10-11 NOTE — Telephone Encounter (Signed)
What symptoms do you have? Seen Dr. Oswaldo Done last Thursday for gout in left foot and Oswaldo Done told him to call back today if no better.  How long have you been sick? Last Wednesday  Have you been seen for this problem? Last Thursday  If your provider decides to give you a prescription, which pharmacy would you like for it to be sent to? [facility] in [place]   Patient informed that this information will be sent to the clinical staff for review and that they should receive a follow up call.

## 2018-10-11 NOTE — Telephone Encounter (Signed)
Patient aware. He was out of work today.  Placed work note at front for patient to be out 10/11/2018 and 10/12/2018.

## 2018-12-08 ENCOUNTER — Ambulatory Visit: Payer: BLUE CROSS/BLUE SHIELD | Admitting: Family Medicine

## 2018-12-08 ENCOUNTER — Other Ambulatory Visit: Payer: Self-pay

## 2018-12-08 ENCOUNTER — Encounter: Payer: Self-pay | Admitting: Family Medicine

## 2018-12-08 VITALS — BP 149/96 | HR 85 | Temp 97.9°F | Ht 70.0 in | Wt 212.0 lb

## 2018-12-08 DIAGNOSIS — Z20828 Contact with and (suspected) exposure to other viral communicable diseases: Secondary | ICD-10-CM | POA: Diagnosis not present

## 2018-12-08 DIAGNOSIS — H1033 Unspecified acute conjunctivitis, bilateral: Secondary | ICD-10-CM

## 2018-12-08 DIAGNOSIS — R030 Elevated blood-pressure reading, without diagnosis of hypertension: Secondary | ICD-10-CM | POA: Diagnosis not present

## 2018-12-08 DIAGNOSIS — K219 Gastro-esophageal reflux disease without esophagitis: Secondary | ICD-10-CM | POA: Diagnosis not present

## 2018-12-08 MED ORDER — OMEPRAZOLE 20 MG PO CPDR: 20.0000 mg | DELAYED_RELEASE_CAPSULE | Freq: Two times a day (BID) | ORAL | 3 refills | Status: DC

## 2018-12-08 MED ORDER — POLYMYXIN B-TRIMETHOPRIM 10000-0.1 UNIT/ML-% OP SOLN: 2.0000 [drp] | Freq: Four times a day (QID) | OPHTHALMIC | 0 refills | Status: AC

## 2018-12-08 MED ORDER — OSELTAMIVIR PHOSPHATE 75 MG PO CAPS: 75.0000 mg | ORAL_CAPSULE | Freq: Every day | ORAL | 0 refills | Status: AC

## 2018-12-08 NOTE — Progress Notes (Signed)
Subjective:  Patient ID: Andre Hall, male    DOB: [birth year], 33 y.o.   MRN: [id number]  Chief Complaint:  Left eye red, irritated and itching and Abdominal Pain   HPI: Andre Hall is a 33 y.o. male presenting on 12/08/2018 for Left eye red, irritated and itching and Abdominal Pain  Pt presents today for epigastric pain and bilateral eye redness, drainage, matting, and pruritis. Pt states the eye irritation started two days ago and is getting worse. States his son recently had "pink eye". Pt states his eyes were matted shut this morning when he woke up. He has not tried any home remedies for the symptoms.  Pt state he has also noticed an increase in his GERD symptoms. States he has epigastric pain that is worse at night when in bed. States he is compliant with his omeprazole, but only takes it once daily. Pt denies dysphagia, cough, voice changes, or hemoptysis. No sore throat.  Pts blood pressure noted to be elevated today in the office. Pt is not interested in starting medication therapy at this time. I had a long discussion about diet, exercise, and weight management with pt.  Relevant past medical, surgical, family, and social history reviewed and updated as indicated.  Allergies and medications reviewed and updated.   Past Medical History:  Diagnosis Date  . Elevated triglycerides with high cholesterol 03/05/2015  . Generalized anxiety disorder 09/03/2018  . GERD (gastroesophageal reflux disease)     Past Surgical History:  Procedure Laterality Date  . HERNIA REPAIR     2006/2007    Social History   Socioeconomic History  . Marital status: Married    Spouse name: Not on file  . Number of children: Not on file  . Years of education: Not on file  . Highest education level: Not on file  Occupational History  . Not on file  Social Needs  . Financial resource strain: Not on file  . Food insecurity:    Worry: Not on file    Inability: Not on file  .  Transportation needs:    Medical: Not on file    Non-medical: Not on file  Tobacco Use  . Smoking status: Current Every Day Smoker    Packs/day: 1.00    Types: Cigarettes  . Smokeless tobacco: Never Used  Substance and Sexual Activity  . Alcohol use: Yes    Alcohol/week: 6.0 standard drinks    Types: 6 Standard drinks or equivalent per week  . Drug use: No  . Sexual activity: Not on file  Lifestyle  . Physical activity:    Days per week: Not on file    Minutes per session: Not on file  . Stress: Not on file  Relationships  . Social connections:    Talks on phone: Not on file    Gets together: Not on file    Attends religious service: Not on file    Active member of club or organization: Not on file    Attends meetings of clubs or organizations: Not on file    Relationship status: Not on file  . Intimate partner violence:    Fear of current or ex partner: Not on file    Emotionally abused: Not on file    Physically abused: Not on file    Forced sexual activity: Not on file  Other Topics Concern  . Not on file  Social History Narrative  . Not on file    Outpatient Encounter  Medications as of 12/08/2018  Medication Sig  . multivitamin (ONE-A-DAY MEN'S) TABS tablet Take 1 tablet by mouth daily.  . Omega-3 Fatty Acids (FISH OIL) 1000 MG CAPS Take 2 capsules by mouth 2 (two) times daily.  . [DISCONTINUED] omeprazole (PRILOSEC) 20 MG capsule Take 1 capsule (20 mg total) by mouth daily.  Marland Kitchen omeprazole (PRILOSEC) 20 MG capsule Take 1 capsule (20 mg total) by mouth 2 (two) times daily before a meal.  . oseltamivir (TAMIFLU) 75 MG capsule Take 1 capsule (75 mg total) by mouth daily for 10 days.  Marland Kitchen trimethoprim-polymyxin b (POLYTRIM) ophthalmic solution Place 2 drops into both eyes every 6 (six) hours for 5 days.  . [DISCONTINUED] naproxen (EC NAPROSYN) 500 MG EC tablet Take 1 tablet (500 mg total) by mouth 2 (two) times daily with a meal.  . [DISCONTINUED] predniSONE (STERAPRED  UNI-PAK 21 TAB) 10 MG (21) TBPK tablet Take by mouth daily. As directed x 6 days   No facility-administered encounter medications on file as of 12/08/2018.     No Known Allergies  Review of Systems  Constitutional: Negative for chills, fatigue and fever.  HENT: Negative for congestion, sore throat, trouble swallowing and voice change.   Eyes: Positive for discharge, redness and itching. Negative for photophobia and visual disturbance.  Respiratory: Negative for cough, chest tightness, shortness of breath and wheezing.   Cardiovascular: Negative for chest pain, palpitations and leg swelling.  Gastrointestinal: Positive for abdominal pain. Negative for abdominal distention, anal bleeding, blood in stool, constipation, diarrhea, nausea, rectal pain and vomiting.  Endocrine: Negative for cold intolerance, heat intolerance, polydipsia, polyphagia and polyuria.  Musculoskeletal: Negative for arthralgias and myalgias.  Neurological: Negative for dizziness, tremors, seizures, syncope, facial asymmetry, speech difficulty, weakness, light-headedness, numbness and headaches.  All other systems reviewed and are negative.       Objective:  BP (!) 149/96   Pulse 85   Temp 97.9 F (36.6 C) (Oral)   Ht 5\' 10"  (1.778 m)   Wt 212 lb (96.2 kg)   BMI 30.42 kg/m    Wt Readings from Last 3 Encounters:  12/08/18 212 lb (96.2 kg)  10/07/18 213 lb 4 oz (96.7 kg)  09/03/18 213 lb (96.6 kg)    Physical Exam Vitals signs and nursing note reviewed.  Constitutional:      General: He is not in acute distress.    Appearance: Normal appearance. He is well-developed. He is not ill-appearing or toxic-appearing.  HENT:     Head: Normocephalic and atraumatic.     Jaw: There is normal jaw occlusion.     Right Ear: Hearing, tympanic membrane, ear canal and external ear normal.     Left Ear: Hearing, tympanic membrane, ear canal and external ear normal.     Nose: Nose normal. No congestion or rhinorrhea.      Mouth/Throat:     Lips: Pink.     Mouth: Mucous membranes are moist.     Pharynx: Oropharynx is clear. No pharyngeal swelling, oropharyngeal exudate or posterior oropharyngeal erythema.  Eyes:     General:        Right eye: Discharge present. No foreign body or hordeolum.        Left eye: Discharge present.No foreign body or hordeolum.     Extraocular Movements: Extraocular movements intact.     Conjunctiva/sclera:     Right eye: Right conjunctiva is injected. No hemorrhage.    Left eye: Left conjunctiva is injected. No hemorrhage.    Pupils:  Pupils are equal, round, and reactive to light.  Neck:     Musculoskeletal: Full passive range of motion without pain and neck supple.     Thyroid: No thyroid mass, thyromegaly or thyroid tenderness.     Vascular: No carotid bruit or JVD.     Trachea: Trachea and phonation normal.  Cardiovascular:     Rate and Rhythm: Normal rate and regular rhythm.     Chest Wall: PMI is not displaced.     Heart sounds: Normal heart sounds. No murmur. No friction rub. No gallop.   Pulmonary:     Effort: Pulmonary effort is normal. No respiratory distress.     Breath sounds: Normal breath sounds.  Abdominal:     General: Bowel sounds are normal. There is no distension or abdominal bruit. There are no signs of injury.     Palpations: Abdomen is soft. There is no hepatomegaly or splenomegaly.     Tenderness: There is abdominal tenderness in the epigastric area. There is no right CVA tenderness, left CVA tenderness, guarding or rebound. Negative signs include Murphy's sign and McBurney's sign.     Hernia: No hernia is present.  Skin:    General: Skin is warm and dry.     Capillary Refill: Capillary refill takes less than 2 seconds.  Neurological:     General: No focal deficit present.     Mental Status: He is alert and oriented to person, place, and time.  Psychiatric:        Mood and Affect: Mood normal.        Behavior: Behavior normal. Behavior is  cooperative.        Thought Content: Thought content normal.        Judgment: Judgment normal.     Results for orders placed or performed in visit on 09/03/18  CBC with Differential  Result Value Ref Range   WBC 3.4 3.4 - 10.8 x10E3/uL   RBC 5.47 4.14 - 5.80 x10E6/uL   Hemoglobin 16.2 13.0 - 17.7 g/dL   Hematocrit 16.1 09.6 - 51.0 %   MCV 85 79 - 97 fL   MCH 29.6 26.6 - 33.0 pg   MCHC 35.0 31.5 - 35.7 g/dL   RDW 04.5 (L) 40.9 - 81.1 %   Platelets 157 150 - 450 x10E3/uL   Neutrophils 52 Not Estab. %   Lymphs 35 Not Estab. %   Monocytes 10 Not Estab. %   Eos 2 Not Estab. %   Basos 1 Not Estab. %   Neutrophils Absolute 1.7 1.4 - 7.0 x10E3/uL   Lymphocytes Absolute 1.2 0.7 - 3.1 x10E3/uL   Monocytes Absolute 0.4 0.1 - 0.9 x10E3/uL   EOS (ABSOLUTE) 0.1 0.0 - 0.4 x10E3/uL   Basophils Absolute 0.0 0.0 - 0.2 x10E3/uL   Immature Granulocytes 0 Not Estab. %   Immature Grans (Abs) 0.0 0.0 - 0.1 x10E3/uL  Comprehensive metabolic panel  Result Value Ref Range   Glucose 80 65 - 99 mg/dL   BUN 11 6 - 20 mg/dL   Creatinine, Ser 9.14 0.76 - 1.27 mg/dL   GFR calc non Af Amer 116 >59 mL/min/1.73   GFR calc Af Amer 135 >59 mL/min/1.73   BUN/Creatinine Ratio 13 9 - 20   Sodium 139 134 - 144 mmol/L   Potassium 4.0 3.5 - 5.2 mmol/L   Chloride 98 96 - 106 mmol/L   CO2 24 20 - 29 mmol/L   Calcium 9.3 8.7 - 10.2 mg/dL   Total Protein  7.2 6.0 - 8.5 g/dL   Albumin 4.8 3.5 - 5.5 g/dL   Globulin, Total 2.4 1.5 - 4.5 g/dL   Albumin/Globulin Ratio 2.0 1.2 - 2.2   Bilirubin Total 0.5 0.0 - 1.2 mg/dL   Alkaline Phosphatase 56 39 - 117 IU/L   AST 29 0 - 40 IU/L   ALT 28 0 - 44 IU/L  TSH  Result Value Ref Range   TSH 1.540 0.450 - 4.500 uIU/mL  Lipid panel  Result Value Ref Range   Cholesterol, Total 148 100 - 199 mg/dL   Triglycerides 962 0 - 149 mg/dL   HDL 26 (L) >83 mg/dL   VLDL Cholesterol Cal 21 5 - 40 mg/dL   LDL Calculated 662 (H) 0 - 99 mg/dL   Chol/HDL Ratio 5.7 (H) 0.0 - 5.0 ratio   HIV antibody  Result Value Ref Range   HIV Screen 4th Generation wRfx Non Reactive Non Reactive  VITAMIN D 25 Hydroxy (Vit-D Deficiency, Fractures)  Result Value Ref Range   Vit D, 25-Hydroxy 28.7 (L) 30.0 - 100.0 ng/mL       Pertinent labs & imaging results that were available during my care of the patient were reviewed by me and considered in my medical decision making.  Assessment & Plan:  Kerry was seen today for left eye red, irritated and itching and diarrhea, abdominal pain.  Diagnoses and all orders for this visit:  Gastroesophageal reflux disease without esophagitis Increased symptoms over the last few days. Will step up therapy to twice daily omeprazole. Report any new or worsening symptoms.  -     omeprazole (PRILOSEC) 20 MG capsule; Take 1 capsule (20 mg total) by mouth 2 (two) times daily before a meal.  Acute bacterial conjunctivitis of both eyes Bilateral matting and conjunctival injection with scleral injection. Will treat with below. Infection prevention discussed.  -     trimethoprim-polymyxin b (POLYTRIM) ophthalmic solution; Place 2 drops into both eyes every 6 (six) hours for 5 days.  Exposure to influenza Son positive for influenza on Tuesday, would like preventative therapy. Afebrile today, no indications for testing.  -     oseltamivir (TAMIFLU) 75 MG capsule; Take 1 capsule (75 mg total) by mouth daily for 10 days.  Elevated blood pressure reading DASH diet discussed. Diet and exercise encouraged. Pt reluctant to start medication therapy. Pt aware if BP is elevated at next visit we will need to initiate therapy.     Continue all other maintenance medications.  Follow up plan: Return in about 4 weeks (around 01/05/2019), or if symptoms worsen or fail to improve, for GERD.  Educational handout given for pink eye  The above assessment and management plan was discussed with the patient. The patient verbalized understanding of and has agreed to the  management plan. Patient is aware to call the clinic if symptoms persist or worsen. Patient is aware when to return to the clinic for a follow-up visit. Patient educated on when it is appropriate to go to the emergency department.   Kari Baars, FNP-C [facility] [phone]

## 2018-12-08 NOTE — Patient Instructions (Signed)
Bacterial Conjunctivitis, Adult  Bacterial conjunctivitis is an infection of your conjunctiva. This is the clear membrane that covers the white part of your eye and the inner part of your eyelid. This infection can make your eye:  · Red or pink.  · Itchy.  This condition spreads easily from person to person (is contagious) and from one eye to the other eye.  What are the causes?  · This condition is caused by germs (bacteria). You may get the infection if you come into close contact with:  ? A person who has the infection.  ? Items that have germs on them (are contaminated), such as face towels, contact lens solution, or eye makeup.  What increases the risk?  You are more likely to get this condition if you:  · Have contact with people who have the infection.  · Wear contact lenses.  · Have a sinus infection.  · Have had a recent eye injury or surgery.  · Have a weak body defense system (immune system).  · Have dry eyes.  What are the signs or symptoms?    · Thick, yellowish discharge from the eye.  · Tearing or watery eyes.  · Itchy eyes.  · Burning feeling in your eyes.  · Eye redness.  · Swollen eyelids.  · Blurred vision.  How is this treated?    · Antibiotic eye drops or ointment.  · Antibiotic medicine taken by mouth. This is used for infections that do not get better with drops or ointment or that last more than 10 days.  · Cool, wet cloths placed on the eyes.  · Artificial tears used 2-6 times a day.  Follow these instructions at home:  Medicines  · Take or apply your antibiotic medicine as told by your doctor. Do not stop taking or applying the antibiotic even if you start to feel better.  · Take or apply over-the-counter and prescription medicines only as told by your doctor.  · Do not touch your eyelid with the eye-drop bottle or the ointment tube.  Managing discomfort  · Wipe any fluid from your eye with a warm, wet washcloth or a cotton ball.  · Place a clean, cool, wet cloth on your eye. Do this for  10-20 minutes, 3-4 times per day.  General instructions  · Do not wear contacts until the infection is gone. Wear glasses until your doctor says it is okay to wear contacts again.  · Do not wear eye makeup until the infection is gone. Throw away old eye makeup.  · Change or wash your pillowcase every day.  · Do not share towels or washcloths.  · Wash your hands often with soap and water. Use paper towels to dry your hands.  · Do not touch or rub your eyes.  · Do not drive or use heavy machinery if your vision is blurred.  Contact a doctor if:  · You have a fever.  · You do not get better after 10 days.  Get help right away if:  · You have a fever and your symptoms get worse all of a sudden.  · You have very bad pain when you move your eye.  · Your face:  ? Hurts.  ? Is red.  ? Is swollen.  · You have sudden loss of vision.  Summary  · Bacterial conjunctivitis is an infection of your conjunctiva.  · This infection spreads easily from person to person.  · Wash your hands often   with soap and water. Use paper towels to dry your hands.  · Take or apply your antibiotic medicine as told by your doctor.  · Contact a doctor if you have a fever or you do not get better after 10 days.  This information is not intended to replace advice given to you by your health care provider. Make sure you discuss any questions you have with your health care provider.  Document Released: 06/17/2008 Document Revised: 04/14/2018 Document Reviewed: 04/14/2018  Elsevier Interactive Patient Education © 2019 [employer]

## 2018-12-13 ENCOUNTER — Telehealth: Payer: Self-pay | Admitting: Family Medicine

## 2018-12-13 ENCOUNTER — Other Ambulatory Visit: Payer: Self-pay | Admitting: Family Medicine

## 2018-12-13 DIAGNOSIS — R110 Nausea: Secondary | ICD-10-CM

## 2018-12-13 MED ORDER — ONDANSETRON HCL 4 MG PO TABS: 4.0000 mg | ORAL_TABLET | Freq: Three times a day (TID) | ORAL | 0 refills | Status: DC | PRN

## 2018-12-13 NOTE — Telephone Encounter (Signed)
Stopped tamiflu 2 days ago.  Tried pepto with no relief

## 2018-12-13 NOTE — Telephone Encounter (Signed)
Can take imodium over the counter and I will send in some zofran for the nausea.

## 2018-12-13 NOTE — Telephone Encounter (Signed)
Patient aware.

## 2018-12-14 ENCOUNTER — Telehealth: Payer: Self-pay | Admitting: Family Medicine

## 2018-12-14 NOTE — Telephone Encounter (Signed)
Letter printed and put up front for pt and pt is aware. Offered to fax but pt states he didn't have a fax number for me to send it to.

## 2018-12-22 ENCOUNTER — Telehealth: Payer: Self-pay | Admitting: Physician Assistant

## 2018-12-22 ENCOUNTER — Encounter: Payer: Self-pay | Admitting: *Deleted

## 2018-12-22 NOTE — Telephone Encounter (Signed)
Pt aware.

## 2018-12-22 NOTE — Telephone Encounter (Signed)
Please advise on note?  

## 2018-12-22 NOTE — Telephone Encounter (Signed)
That is fine to give him a note.  Do not include the mother's name but okay to say that his mother only has allergies and not a viral illness

## 2019-06-15 ENCOUNTER — Other Ambulatory Visit: Payer: Self-pay | Admitting: Family Medicine

## 2019-06-15 DIAGNOSIS — K219 Gastro-esophageal reflux disease without esophagitis: Secondary | ICD-10-CM

## 2019-06-15 MED ORDER — OMEPRAZOLE 20 MG PO CPDR: 20.0000 mg | DELAYED_RELEASE_CAPSULE | Freq: Two times a day (BID) | ORAL | 0 refills | Status: DC

## 2019-06-15 NOTE — Telephone Encounter (Signed)
Aware refill sent to pharmacy, will need to make 6 mos ckup

## 2019-07-06 ENCOUNTER — Ambulatory Visit (INDEPENDENT_AMBULATORY_CARE_PROVIDER_SITE_OTHER): Payer: BC Managed Care – PPO | Admitting: Family Medicine

## 2019-07-06 DIAGNOSIS — M10072 Idiopathic gout, left ankle and foot: Secondary | ICD-10-CM

## 2019-07-06 MED ORDER — COLCHICINE 0.6 MG PO TABS: ORAL_TABLET | ORAL | 1 refills | Status: DC

## 2019-07-06 NOTE — Patient Instructions (Signed)
Gout  Gout is painful swelling of your joints. Gout is a type of arthritis. It is caused by having too much uric acid in your body. Uric acid is a chemical that is made when your body breaks down substances called purines. If your body has too much uric acid, sharp crystals can form and build up in your joints. This causes pain and swelling. Gout attacks can happen quickly and be very painful (acute gout). Over time, the attacks can affect more joints and happen more often (chronic gout). What are the causes?  Too much uric acid in your blood. This can happen because: ? Your kidneys do not remove enough uric acid from your blood. ? Your body makes too much uric acid. ? You eat too many foods that are high in purines. These foods include organ meats, some seafood, and beer.  Trauma or stress. What increases the risk?  Having a family history of gout.  Being male and middle-aged.  Being male and having gone through menopause.  Being very overweight (obese).  Drinking alcohol, especially beer.  Not having enough water in the body (being dehydrated).  Losing weight too quickly.  Having an organ transplant.  Having lead poisoning.  Taking certain medicines.  Having kidney disease.  Having a skin condition called psoriasis. What are the signs or symptoms? An attack of acute gout usually happens in just one joint. The most common place is the big toe. Attacks often start at night. Other joints that may be affected include joints of the feet, ankle, knee, fingers, wrist, or elbow. Symptoms of an attack may include:  Very bad pain.  Warmth.  Swelling.  Stiffness.  Shiny, red, or purple skin.  Tenderness. The affected joint may be very painful to touch.  Chills and fever. Chronic gout may cause symptoms more often. More joints may be involved. You may also have white or yellow lumps (tophi) on your hands or feet or in other areas near your joints. How is this  treated?  Treatment for this condition has two phases: treating an acute attack and preventing future attacks.  Acute gout treatment may include: ? NSAIDs. ? Steroids. These are taken by mouth or injected into a joint. ? Colchicine. This medicine relieves pain and swelling. It can be given by mouth or through an IV tube.  Preventive treatment may include: ? Taking small doses of NSAIDs or colchicine daily. ? Using a medicine that reduces uric acid levels in your blood. ? Making changes to your diet. You may need to see a food expert (dietitian) about what to eat and drink to prevent gout. Follow these instructions at home: During a gout attack   If told, put ice on the painful area: ? Put ice in a plastic bag. ? Place a towel between your skin and the bag. ? Leave the ice on for 20 minutes, 2-3 times a day.  Raise (elevate) the painful joint above the level of your heart as often as you can.  Rest the joint as much as possible. If the joint is in your leg, you may be given crutches.  Follow instructions from your doctor about what you cannot eat or drink. Avoiding future gout attacks  Eat a low-purine diet. Avoid foods and drinks such as: ? Liver. ? Kidney. ? Anchovies. ? Asparagus. ? Herring. ? Mushrooms. ? Mussels. ? Beer.  Stay at a healthy weight. If you want to lose weight, talk with your doctor. Do not lose weight   too fast.  Start or continue an exercise plan as told by your doctor. Eating and drinking  Drink enough fluids to keep your pee (urine) pale yellow.  If you drink alcohol: ? Limit how much you use to:  0-1 drink a day for women.  0-2 drinks a day for men. ? Be aware of how much alcohol is in your drink. In the [place] one drink equals one 12 oz bottle of beer (355 mL), one 5 oz glass of wine (148 mL), or one 1 oz glass of hard liquor (44 mL). General instructions  Take over-the-counter and prescription medicines only as told by your doctor.  Do  not drive or use heavy machinery while taking prescription pain medicine.  Return to your normal activities as told by your doctor. Ask your doctor what activities are safe for you.  Keep all follow-up visits as told by your doctor. This is important. Contact a doctor if:  You have another gout attack.  You still have symptoms of a gout attack after 10 days of treatment.  You have problems (side effects) because of your medicines.  You have chills or a fever.  You have burning pain when you pee (urinate).  You have pain in your lower back or belly. Get help right away if:  You have very bad pain.  Your pain cannot be controlled.  You cannot pee. Summary  Gout is painful swelling of the joints.  The most common site of pain is the big toe, but it can affect other joints.  Medicines and avoiding some foods can help to prevent and treat gout attacks. This information is not intended to replace advice given to you by your health care provider. Make sure you discuss any questions you have with your health care provider. Document Released: 06/17/2008 Document Revised: 03/31/2018 Document Reviewed: 03/31/2018 Elsevier Patient Education  2020 Elsevier Inc.  

## 2019-07-06 NOTE — Progress Notes (Signed)
Telephone visit  Subjective: CC: gout PCP: Baruch Gouty, FNP KDX:[id number] Andre Hall is a 33 y.o. male calls for telephone consult today. Patient provides verbal consent for consult held via phone.  Location of patient: car Location of provider: [facility] Others present for call: none  1. Gout Patient reports that he had a gout attack that started in left great toe on Friday.  He started prednisone and has 2 days left but symptoms have not resolved totally. Symptoms have gotten some better but he still has redness, swelling and pain.   ROS: Per HPI  No Known Allergies Past Medical History:  Diagnosis Date  . Elevated triglycerides with high cholesterol 03/05/2015  . Generalized anxiety disorder 09/03/2018  . GERD (gastroesophageal reflux disease)     Current Outpatient Medications:  .  multivitamin (ONE-A-DAY MEN'S) TABS tablet, Take 1 tablet by mouth daily., Disp: , Rfl:  .  Omega-3 Fatty Acids (FISH OIL) 1000 MG CAPS, Take 2 capsules by mouth 2 (two) times daily., Disp: , Rfl:  .  omeprazole (PRILOSEC) 20 MG capsule, Take 1 capsule (20 mg total) by mouth 2 (two) times daily before a meal., Disp: 30 capsule, Rfl: 0 .  ondansetron (ZOFRAN) 4 MG tablet, Take 1 tablet (4 mg total) by mouth every 8 (eight) hours as needed for nausea or vomiting., Disp: 20 tablet, Rfl: 0  Assessment/ Plan: 33 y.o. male   1. Acute idiopathic gout involving toe of left foot Not responding much to prednisone.  We discussed switching to colchicine.  He can take 2 tablets now then take 1 tablet twice daily until gout flare resolves.  We discussed taking this with food and plenty of water.  If no significant improvement in the next several days on the colchicine he is to seek evaluation in office.  Differential diagnosis includes septic joint, pseudogout. - colchicine 0.6 MG tablet; Take 2 tablets as first sign of gout.  Then take 1 tablet twice daily until gout flare resolves.  Dispense: 60 tablet; Refill:  1   Start time: 5:33pm End time: 5:39pm  Total time spent on patient care (including telephone call/ virtual visit): 11 minutes  [place], [place] [phone]

## 2019-07-13 ENCOUNTER — Other Ambulatory Visit: Payer: Self-pay | Admitting: Family Medicine

## 2019-07-13 DIAGNOSIS — K219 Gastro-esophageal reflux disease without esophagitis: Secondary | ICD-10-CM

## 2019-07-28 ENCOUNTER — Other Ambulatory Visit: Payer: Self-pay | Admitting: Family Medicine

## 2019-07-28 ENCOUNTER — Ambulatory Visit (INDEPENDENT_AMBULATORY_CARE_PROVIDER_SITE_OTHER): Payer: BC Managed Care – PPO | Admitting: Family Medicine

## 2019-07-28 ENCOUNTER — Encounter: Payer: Self-pay | Admitting: Family Medicine

## 2019-07-28 DIAGNOSIS — M10072 Idiopathic gout, left ankle and foot: Secondary | ICD-10-CM

## 2019-07-28 HISTORY — DX: Idiopathic gout, left ankle and foot: M10.072

## 2019-07-28 MED ORDER — PREDNISONE 10 MG (21) PO TBPK: ORAL_TABLET | ORAL | 0 refills | Status: DC

## 2019-07-28 NOTE — Progress Notes (Addendum)
Virtual Visit via telephone Note Due to COVID-19 pandemic this visit was conducted virtually. This visit type was conducted due to national recommendations for restrictions regarding the COVID-19 Pandemic (e.g. social distancing, sheltering in place) in an effort to limit this patient's exposure and mitigate transmission in our community. All issues noted in this document were discussed and addressed.  A physical exam was not performed with this format.   I connected with Andre Hall on 07/28/2019 at 1140 by telephone and verified that I am speaking with the correct person using two identifiers. Andre Hall is currently located at home and family is currently with them during visit. The provider, Kari Baars, FNP is located in their office at time of visit.  I discussed the limitations, risks, security and privacy concerns of performing an evaluation and management service by telephone and the availability of in person appointments. I also discussed with the patient that there may be a patient responsible charge related to this service. The patient expressed understanding and agreed to proceed.  Subjective:  Patient ID: Andre Hall, male    DOB: 1986/06/30, 33 y.o.   MRN: [id number]  Chief Complaint:  Gout   HPI: Andre Hall is a 33 y.o. male presenting on 07/28/2019 for Gout   Pt reports left great toe pain, redness, and swelling. State the swelling is moving up into his foot. Denies injury or insect bite. No open wounds. No fever, chills, red streaking, or calf pain. No known sick exposures. Pt states he has a history of gout and this is what this feels like. He reports any touch to his toe is painful. Unable to wear socks or shoes. States pain can be a 7/10 at times. He reports he has changed his diet without resolution of symptoms.     Relevant past medical, surgical, family, and social history reviewed and updated as indicated.  Allergies and medications reviewed and  updated.   Past Medical History:  Diagnosis Date  . Acute idiopathic gout involving toe of left foot 07/28/2019  . Elevated triglycerides with high cholesterol 03/05/2015  . Generalized anxiety disorder 09/03/2018  . GERD (gastroesophageal reflux disease)     Past Surgical History:  Procedure Laterality Date  . HERNIA REPAIR     2006/2007    Social History   Socioeconomic History  . Marital status: Married    Spouse name: Not on file  . Number of children: Not on file  . Years of education: Not on file  . Highest education level: Not on file  Occupational History  . Not on file  Social Needs  . Financial resource strain: Not on file  . Food insecurity    Worry: Not on file    Inability: Not on file  . Transportation needs    Medical: Not on file    Non-medical: Not on file  Tobacco Use  . Smoking status: Current Every Day Smoker    Packs/day: 1.00    Types: Cigarettes  . Smokeless tobacco: Never Used  Substance and Sexual Activity  . Alcohol use: Yes    Alcohol/week: 6.0 standard drinks    Types: 6 Standard drinks or equivalent per week  . Drug use: No  . Sexual activity: Not on file  Lifestyle  . Physical activity    Days per week: Not on file    Minutes per session: Not on file  . Stress: Not on file  Relationships  . Social connections  Talks on phone: Not on file    Gets together: Not on file    Attends religious service: Not on file    Active member of club or organization: Not on file    Attends meetings of clubs or organizations: Not on file    Relationship status: Not on file  . Intimate partner violence    Fear of current or ex partner: Not on file    Emotionally abused: Not on file    Physically abused: Not on file    Forced sexual activity: Not on file  Other Topics Concern  . Not on file  Social History Narrative  . Not on file    Outpatient Encounter Medications as of 07/28/2019  Medication Sig  . colchicine 0.6 MG tablet Take 2  tablets as first sign of gout.  Then take 1 tablet twice daily until gout flare resolves.  . multivitamin (ONE-A-DAY MEN'S) TABS tablet Take 1 tablet by mouth daily.  . Omega-3 Fatty Acids (FISH OIL) 1000 MG CAPS Take 2 capsules by mouth 2 (two) times daily.  Marland Kitchen. omeprazole (PRILOSEC) 20 MG capsule TAKE 1 CAPSULE (20 MG TOTAL) BY MOUTH 2 (TWO) TIMES DAILY BEFORE A MEAL.  Marland Kitchen. ondansetron (ZOFRAN) 4 MG tablet Take 1 tablet (4 mg total) by mouth every 8 (eight) hours as needed for nausea or vomiting.  . predniSONE (STERAPRED UNI-PAK 21 TAB) 10 MG (21) TBPK tablet As directed x 6 days   No facility-administered encounter medications on file as of 07/28/2019.     No Known Allergies  Review of Systems  Constitutional: Negative for activity change, appetite change, chills, fatigue and fever.  HENT: Negative.   Eyes: Negative.   Respiratory: Negative for cough, chest tightness and shortness of breath.   Cardiovascular: Negative for chest pain, palpitations and leg swelling.  Gastrointestinal: Negative for blood in stool, constipation, diarrhea, nausea and vomiting.  Endocrine: Negative.   Genitourinary: Negative for dysuria, frequency and urgency.  Musculoskeletal: Positive for arthralgias and joint swelling. Negative for myalgias.  Skin: Positive for color change.  Allergic/Immunologic: Negative.   Neurological: Negative for dizziness and headaches.  Hematological: Negative.   Psychiatric/Behavioral: Negative for confusion, hallucinations, sleep disturbance and suicidal ideas.  All other systems reviewed and are negative.        Observations/Objective: No vital signs or physical exam, this was a telephone or virtual health encounter.  Pt alert and oriented, answers all questions appropriately, and able to speak in full sentences.    Assessment and Plan: [name] was seen today for gout.  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 History of gout. Will initiate prednisone for acute flare. Diet discussed in detail. Discussed need for lab work to see if preventative therapy is warranted due to recurrent episodes. Pt will make an appointment for CPE.  -     predniSONE (STERAPRED UNI-PAK 21 TAB) 10 MG (21) TBPK tablet; As directed x 6 days     Follow Up Instructions: Return if symptoms worsen or fail to improve.    I discussed the assessment and treatment plan with the patient. The patient was provided an opportunity to ask questions and all were answered. The patient agreed with the plan and demonstrated an understanding of the instructions.   The patient was advised to call back or seek an in-person evaluation if the symptoms worsen or if the condition fails to improve as anticipated.  The above assessment  and management plan was discussed with the patient. The patient verbalized understanding of and has agreed to the management plan. Patient is aware to call the clinic if they develop any new symptoms or if symptoms persist or worsen. Patient is aware when to return to the clinic for a follow-up visit. Patient educated on when it is appropriate to go to the emergency department.    I provided 15 minutes of non-face-to-face time during this encounter. The call started at 1140. The call ended at 1155. The other time was used for coordination of care.    Monia Pouch, FNP-C [facility] [address] [place], [place] [address] [phone] 07/28/2019

## 2019-07-28 NOTE — Patient Instructions (Signed)
Gout  Gout is painful swelling of your joints. Gout is a type of arthritis. It is caused by having too much uric acid in your body. Uric acid is a chemical that is made when your body breaks down substances called purines. If your body has too much uric acid, sharp crystals can form and build up in your joints. This causes pain and swelling. Gout attacks can happen quickly and be very painful (acute gout). Over time, the attacks can affect more joints and happen more often (chronic gout). What are the causes?  Too much uric acid in your blood. This can happen because: ? Your kidneys do not remove enough uric acid from your blood. ? Your body makes too much uric acid. ? You eat too many foods that are high in purines. These foods include organ meats, some seafood, and beer.  Trauma or stress. What increases the risk?  Having a family history of gout.  Being male and middle-aged.  Being male and having gone through menopause.  Being very overweight (obese).  Drinking alcohol, especially beer.  Not having enough water in the body (being dehydrated).  Losing weight too quickly.  Having an organ transplant.  Having lead poisoning.  Taking certain medicines.  Having kidney disease.  Having a skin condition called psoriasis. What are the signs or symptoms? An attack of acute gout usually happens in just one joint. The most common place is the big toe. Attacks often start at night. Other joints that may be affected include joints of the feet, ankle, knee, fingers, wrist, or elbow. Symptoms of an attack may include:  Very bad pain.  Warmth.  Swelling.  Stiffness.  Shiny, red, or purple skin.  Tenderness. The affected joint may be very painful to touch.  Chills and fever. Chronic gout may cause symptoms more often. More joints may be involved. You may also have white or yellow lumps (tophi) on your hands or feet or in other areas near your joints. How is this  treated?  Treatment for this condition has two phases: treating an acute attack and preventing future attacks.  Acute gout treatment may include: ? NSAIDs. ? Steroids. These are taken by mouth or injected into a joint. ? Colchicine. This medicine relieves pain and swelling. It can be given by mouth or through an IV tube.  Preventive treatment may include: ? Taking small doses of NSAIDs or colchicine daily. ? Using a medicine that reduces uric acid levels in your blood. ? Making changes to your diet. You may need to see a food expert (dietitian) about what to eat and drink to prevent gout. Follow these instructions at home: During a gout attack   If told, put ice on the painful area: ? Put ice in a plastic bag. ? Place a towel between your skin and the bag. ? Leave the ice on for 20 minutes, 2-3 times a day.  Raise (elevate) the painful joint above the level of your heart as often as you can.  Rest the joint as much as possible. If the joint is in your leg, you may be given crutches.  Follow instructions from your doctor about what you cannot eat or drink. Avoiding future gout attacks  Eat a low-purine diet. Avoid foods and drinks such as: ? Liver. ? Kidney. ? Anchovies. ? Asparagus. ? Herring. ? Mushrooms. ? Mussels. ? Beer.  Stay at a healthy weight. If you want to lose weight, talk with your doctor. Do not lose weight   too fast.  Start or continue an exercise plan as told by your doctor. Eating and drinking  Drink enough fluids to keep your pee (urine) pale yellow.  If you drink alcohol: ? Limit how much you use to:  0-1 drink a day for women.  0-2 drinks a day for men. ? Be aware of how much alcohol is in your drink. In the [place] one drink equals one 12 oz bottle of beer (355 mL), one 5 oz glass of wine (148 mL), or one 1 oz glass of hard liquor (44 mL). General instructions  Take over-the-counter and prescription medicines only as told by your doctor.  Do  not drive or use heavy machinery while taking prescription pain medicine.  Return to your normal activities as told by your doctor. Ask your doctor what activities are safe for you.  Keep all follow-up visits as told by your doctor. This is important. Contact a doctor if:  You have another gout attack.  You still have symptoms of a gout attack after 10 days of treatment.  You have problems (side effects) because of your medicines.  You have chills or a fever.  You have burning pain when you pee (urinate).  You have pain in your lower back or belly. Get help right away if:  You have very bad pain.  Your pain cannot be controlled.  You cannot pee. Summary  Gout is painful swelling of the joints.  The most common site of pain is the big toe, but it can affect other joints.  Medicines and avoiding some foods can help to prevent and treat gout attacks. This information is not intended to replace advice given to you by your health care provider. Make sure you discuss any questions you have with your health care provider. Document Released: 06/17/2008 Document Revised: 03/31/2018 Document Reviewed: 03/31/2018 Elsevier Patient Education  2020 Elsevier Inc.  

## 2019-08-04 ENCOUNTER — Other Ambulatory Visit: Payer: Self-pay | Admitting: Family Medicine

## 2019-08-04 DIAGNOSIS — K219 Gastro-esophageal reflux disease without esophagitis: Secondary | ICD-10-CM

## 2019-08-09 ENCOUNTER — Other Ambulatory Visit: Payer: Self-pay

## 2019-08-09 ENCOUNTER — Telehealth: Payer: Self-pay | Admitting: Family Medicine

## 2019-08-09 NOTE — Telephone Encounter (Signed)
Patient last had a tele visit with Rakes.  Aware he needs to keep apt tomorrow to have evaluated.

## 2019-08-10 ENCOUNTER — Ambulatory Visit (INDEPENDENT_AMBULATORY_CARE_PROVIDER_SITE_OTHER): Payer: BC Managed Care – PPO

## 2019-08-10 ENCOUNTER — Encounter: Payer: Self-pay | Admitting: Family Medicine

## 2019-08-10 ENCOUNTER — Ambulatory Visit: Payer: BC Managed Care – PPO | Admitting: Family Medicine

## 2019-08-10 VITALS — BP 133/82 | HR 86 | Temp 99.0°F | Resp 20 | Ht 70.0 in | Wt 222.0 lb

## 2019-08-10 DIAGNOSIS — M79675 Pain in left toe(s): Secondary | ICD-10-CM | POA: Diagnosis not present

## 2019-08-10 DIAGNOSIS — E559 Vitamin D deficiency, unspecified: Secondary | ICD-10-CM

## 2019-08-10 DIAGNOSIS — M7989 Other specified soft tissue disorders: Secondary | ICD-10-CM | POA: Diagnosis not present

## 2019-08-10 DIAGNOSIS — R7989 Other specified abnormal findings of blood chemistry: Secondary | ICD-10-CM | POA: Diagnosis not present

## 2019-08-10 DIAGNOSIS — M10072 Idiopathic gout, left ankle and foot: Secondary | ICD-10-CM | POA: Diagnosis not present

## 2019-08-10 MED ORDER — NAPROXEN 500 MG PO TABS: 500.0000 mg | ORAL_TABLET | Freq: Two times a day (BID) | ORAL | 1 refills | Status: DC

## 2019-08-10 MED ORDER — METHYLPREDNISOLONE ACETATE 40 MG/ML IJ SUSP
40.0000 mg | Freq: Once | INTRAMUSCULAR | Status: AC
  Administered 2019-08-10: 40 mg via INTRAMUSCULAR

## 2019-08-10 MED ORDER — KETOROLAC TROMETHAMINE 60 MG/2ML IM SOLN
60.0000 mg | Freq: Once | INTRAMUSCULAR | Status: AC
  Administered 2019-08-10: 60 mg via INTRAMUSCULAR

## 2019-08-10 NOTE — Progress Notes (Addendum)
Subjective:  Patient ID: Andre Hall, male    DOB: [birth year], 33 y.o.   MRN: [id number]  Patient Care Team: Baruch Gouty, FNP as PCP - General (Family Medicine)   Chief Complaint:  Gout (left great toe )   HPI: Andre Hall is a 33 y.o. male presenting on 08/10/2019 for Gout (left great toe )   1. Great toe pain, left Ongoing and worsening pain in left great toe despite recent treatment for gout attack. Pt states he has changed his diet to avoid triggering foods. States he continues to have pain and slight swelling in great toe. States the redness has subsided. States the pain is worse with movement and weight bearing. Hurts to touch and wear socks. No known injury or previous injuries. States pain is 8/10 with walking. Did complete treatment for gout with minimal improvement in symptoms.    2. Low serum vitamin D Not on repletion therapy. Has had low Vit D in the past. Does have bone pain and myalgias.      Relevant past medical, surgical, family, and social history reviewed and updated as indicated.  Allergies and medications reviewed and updated. Date reviewed: Chart in Epic.   Past Medical History:  Diagnosis Date  . Acute idiopathic gout involving toe of left foot 07/28/2019  . Elevated triglycerides with high cholesterol 03/05/2015  . Generalized anxiety disorder 09/03/2018  . GERD (gastroesophageal reflux disease)     Past Surgical History:  Procedure Laterality Date  . HERNIA REPAIR     2006/2007    Social History   Socioeconomic History  . Marital status: Married    Spouse name: Not on file  . Number of children: Not on file  . Years of education: Not on file  . Highest education level: Not on file  Occupational History  . Not on file  Social Needs  . Financial resource strain: Not on file  . Food insecurity    Worry: Not on file    Inability: Not on file  . Transportation needs    Medical: Not on file    Non-medical: Not on file  Tobacco  Use  . Smoking status: Current Every Day Smoker    Packs/day: 1.00    Types: Cigarettes  . Smokeless tobacco: Never Used  Substance and Sexual Activity  . Alcohol use: Yes    Alcohol/week: 6.0 standard drinks    Types: 6 Standard drinks or equivalent per week  . Drug use: No  . Sexual activity: Not on file  Lifestyle  . Physical activity    Days per week: Not on file    Minutes per session: Not on file  . Stress: Not on file  Relationships  . Social Herbalist on phone: Not on file    Gets together: Not on file    Attends religious service: Not on file    Active member of club or organization: Not on file    Attends meetings of clubs or organizations: Not on file    Relationship status: Not on file  . Intimate partner violence    Fear of current or ex partner: Not on file    Emotionally abused: Not on file    Physically abused: Not on file    Forced sexual activity: Not on file  Other Topics Concern  . Not on file  Social History Narrative  . Not on file    Outpatient Encounter Medications as of 08/10/2019  Medication Sig  . multivitamin (ONE-A-DAY MEN'S) TABS tablet Take 1 tablet by mouth daily.  Marland Kitchen omeprazole (PRILOSEC) 20 MG capsule TAKE 1 CAPSULE (20 MG TOTAL) BY MOUTH 2 (TWO) TIMES DAILY BEFORE A MEAL.  Marland Kitchen colchicine 0.6 MG tablet TAKE 2 TABLETS AS FIRST SIGN OF GOUT. THEN TAKE 1 TABLET TWICE DAILY UNTIL GOUT FLARE RESOLVES. (Patient not taking: Take 2 tablets as first sign of gout.  Then take 1 tablet twice daily until gout flare resolves.)  . naproxen (NAPROSYN) 500 MG tablet Take 1 tablet (500 mg total) by mouth 2 (two) times daily with a meal.  . [DISCONTINUED] Omega-3 Fatty Acids (FISH OIL) 1000 MG CAPS Take 2 capsules by mouth 2 (two) times daily.  . [DISCONTINUED] ondansetron (ZOFRAN) 4 MG tablet Take 1 tablet (4 mg total) by mouth every 8 (eight) hours as needed for nausea or vomiting.  . [DISCONTINUED] predniSONE (STERAPRED UNI-PAK 21 TAB) 10 MG (21)  TBPK tablet As directed x 6 days   No facility-administered encounter medications on file as of 08/10/2019.     No Known Allergies  Review of Systems  Constitutional: Negative for activity change, appetite change, chills, diaphoresis, fatigue, fever and unexpected weight change.  HENT: Negative.   Eyes: Negative.   Respiratory: Negative for cough, chest tightness and shortness of breath.   Cardiovascular: Negative for chest pain, palpitations and leg swelling.  Gastrointestinal: Negative for blood in stool, constipation, diarrhea, nausea and vomiting.  Endocrine: Negative.   Genitourinary: Negative for decreased urine volume, difficulty urinating, dysuria, frequency and urgency.  Musculoskeletal: Positive for arthralgias, gait problem, joint swelling and myalgias.  Skin: Negative.   Allergic/Immunologic: Negative.   Neurological: Negative for dizziness and headaches.  Hematological: Negative.   Psychiatric/Behavioral: Negative for confusion, hallucinations, sleep disturbance and suicidal ideas.  All other systems reviewed and are negative.       Objective:  BP 133/82   Pulse 86   Temp 99 F (37.2 C)   Resp 20   Ht 5' 10"  (1.778 m)   Wt 222 lb (100.7 kg)   SpO2 99%   BMI 31.85 kg/m    Wt Readings from Last 3 Encounters:  08/10/19 222 lb (100.7 kg)  12/08/18 212 lb (96.2 kg)  10/07/18 213 lb 4 oz (96.7 kg)    Physical Exam Vitals signs and nursing note reviewed.  Constitutional:      General: He is not in acute distress.    Appearance: Normal appearance. He is well-developed and well-groomed. He is not ill-appearing, toxic-appearing or diaphoretic.  HENT:     Head: Normocephalic and atraumatic.     Jaw: There is normal jaw occlusion.     Right Ear: Hearing normal.     Left Ear: Hearing normal.     Nose: Nose normal.     Mouth/Throat:     Lips: Pink.     Mouth: Mucous membranes are moist.     Pharynx: Oropharynx is clear. Uvula midline.  Eyes:     General:  Lids are normal.     Extraocular Movements: Extraocular movements intact.     Conjunctiva/sclera: Conjunctivae normal.     Pupils: Pupils are equal, round, and reactive to light.  Neck:     Musculoskeletal: Normal range of motion and neck supple.     Thyroid: No thyroid mass, thyromegaly or thyroid tenderness.     Vascular: No carotid bruit or JVD.     Trachea: Trachea and phonation normal.  Cardiovascular:  Rate and Rhythm: Normal rate and regular rhythm.     Chest Wall: PMI is not displaced.     Pulses: Normal pulses.     Heart sounds: Normal heart sounds. No murmur. No friction rub. No gallop.   Pulmonary:     Effort: Pulmonary effort is normal. No respiratory distress.     Breath sounds: Normal breath sounds. No wheezing.  Abdominal:     General: Bowel sounds are normal. There is no distension or abdominal bruit.     Palpations: Abdomen is soft. There is no hepatomegaly or splenomegaly.     Tenderness: There is no abdominal tenderness. There is no right CVA tenderness or left CVA tenderness.     Hernia: No hernia is present.  Musculoskeletal:     Left ankle: Normal.     Right lower leg: No edema.     Left lower leg: No edema.     Left foot: Decreased range of motion. Normal capillary refill. Tenderness and swelling present. No bony tenderness, crepitus, deformity or laceration.       Feet:  Lymphadenopathy:     Cervical: No cervical adenopathy.  Skin:    General: Skin is warm and dry.     Capillary Refill: Capillary refill takes less than 2 seconds.     Coloration: Skin is not cyanotic, jaundiced or pale.     Findings: No rash.  Neurological:     General: No focal deficit present.     Mental Status: He is alert and oriented to person, place, and time.     Cranial Nerves: Cranial nerves are intact.     Sensory: Sensation is intact.     Motor: Motor function is intact.     Coordination: Coordination is intact.     Gait: Gait is intact.     Deep Tendon Reflexes:  Reflexes are normal and symmetric.  Psychiatric:        Attention and Perception: Attention and perception normal.        Mood and Affect: Mood and affect normal.        Speech: Speech normal.        Behavior: Behavior normal. Behavior is cooperative.        Thought Content: Thought content normal.        Cognition and Memory: Cognition and memory normal.        Judgment: Judgment normal.     Results for orders placed or performed in visit on 09/03/18  CBC with Differential  Result Value Ref Range   WBC 3.4 3.4 - 10.8 x10E3/uL   RBC 5.47 4.14 - 5.80 x10E6/uL   Hemoglobin 16.2 13.0 - 17.7 g/dL   Hematocrit 46.3 37.5 - 51.0 %   MCV 85 79 - 97 fL   MCH 29.6 26.6 - 33.0 pg   MCHC 35.0 31.5 - 35.7 g/dL   RDW 12.2 (L) 12.3 - 15.4 %   Platelets 157 150 - 450 x10E3/uL   Neutrophils 52 Not Estab. %   Lymphs 35 Not Estab. %   Monocytes 10 Not Estab. %   Eos 2 Not Estab. %   Basos 1 Not Estab. %   Neutrophils Absolute 1.7 1.4 - 7.0 x10E3/uL   Lymphocytes Absolute 1.2 0.7 - 3.1 x10E3/uL   Monocytes Absolute 0.4 0.1 - 0.9 x10E3/uL   EOS (ABSOLUTE) 0.1 0.0 - 0.4 x10E3/uL   Basophils Absolute 0.0 0.0 - 0.2 x10E3/uL   Immature Granulocytes 0 Not Estab. %   Immature Grans (Abs)  0.0 0.0 - 0.1 x10E3/uL  Comprehensive metabolic panel  Result Value Ref Range   Glucose 80 65 - 99 mg/dL   BUN 11 6 - 20 mg/dL   Creatinine, Ser 0.86 0.76 - 1.27 mg/dL   GFR calc non Af Amer 116 >59 mL/min/1.73   GFR calc Af Amer 135 >59 mL/min/1.73   BUN/Creatinine Ratio 13 9 - 20   Sodium 139 134 - 144 mmol/L   Potassium 4.0 3.5 - 5.2 mmol/L   Chloride 98 96 - 106 mmol/L   CO2 24 20 - 29 mmol/L   Calcium 9.3 8.7 - 10.2 mg/dL   Total Protein 7.2 6.0 - 8.5 g/dL   Albumin 4.8 3.5 - 5.5 g/dL   Globulin, Total 2.4 1.5 - 4.5 g/dL   Albumin/Globulin Ratio 2.0 1.2 - 2.2   Bilirubin Total 0.5 0.0 - 1.2 mg/dL   Alkaline Phosphatase 56 39 - 117 IU/L   AST 29 0 - 40 IU/L   ALT 28 0 - 44 IU/L  TSH  Result Value  Ref Range   TSH 1.540 0.450 - 4.500 uIU/mL  Lipid panel  Result Value Ref Range   Cholesterol, Total 148 100 - 199 mg/dL   Triglycerides 105 0 - 149 mg/dL   HDL 26 (L) >39 mg/dL   VLDL Cholesterol Cal 21 5 - 40 mg/dL   LDL Calculated 101 (H) 0 - 99 mg/dL   Chol/HDL Ratio 5.7 (H) 0.0 - 5.0 ratio  HIV antibody  Result Value Ref Range   HIV Screen 4th Generation wRfx Non Reactive Non Reactive  VITAMIN D 25 Hydroxy (Vit-D Deficiency, Fractures)  Result Value Ref Range   Vit D, 25-Hydroxy 28.7 (L) 30.0 - 100.0 ng/mL     X-Ray: left great toe: Sesamoid bone to left great toe medial MTP joint, no acute abnormality. Preliminary x-ray reading by Monia Pouch, FNP-C, [facility].   Pertinent labs & imaging results that were available during my care of the patient were reviewed by me and considered in my medical decision making.  Assessment & Plan:  Dantrell was seen today for gout.  Diagnoses and all orders for this visit:  Great toe pain, left Imaging reveals sesamoid bone of left great toe, no obvious fracture. Will notify pt if radiology reading differs. Pt placed in postop shoe for comfort. Toradol and DepoMedrol injection given in office for pain relief.  -     DG Toe Great Left; Future -     CBC with Differential/Platelet -     Vitamin D 25 hydroxy -     naproxen (NAPROSYN) 500 MG tablet; Take 1 tablet (500 mg total) by mouth 2 (two) times daily with a meal.  Acute idiopathic gout involving toe of left foot Ongoing and recurrent symptoms. Will check below. May need to be placed on preventative therapy.  -     Uric acid -     CMP14+EGFR -     CBC with Differential/Platelet  Low serum vitamin D Not on repletion therapy and Vit D has been low in previous results. Will recheck today and start repletion therapy if warranted.  -     CBC with Differential/Platelet -     Vitamin D 25 hydroxy     Continue all other maintenance medications.  Follow up plan: Return in about 6 weeks  (around 09/21/2019), or if symptoms worsen or fail to improve.  Continue healthy lifestyle choices, including diet (rich in fruits, vegetables, and lean proteins, and low in salt  and simple carbohydrates) and exercise (at least 30 minutes of moderate physical activity daily).  Educational handout given for sesamoid injury  The above assessment and management plan was discussed with the patient. The patient verbalized understanding of and has agreed to the management plan. Patient is aware to call the clinic if they develop any new symptoms or if symptoms persist or worsen. Patient is aware when to return to the clinic for a follow-up visit. Patient educated on when it is appropriate to go to the emergency department.   Monia Pouch, FNP-C [facility] [phone]

## 2019-08-10 NOTE — Patient Instructions (Signed)
Sesamoid Injury  A sesamoid injury happens when a sesamoid bone or a surrounding tendon gets damaged during activity. A sesamoid bone is a bone that is connected to a tendon or a muscle, but not to a joint. There are sesamoid bones in your hands, knees, and feet. Your kneecap is an example of a sesamoid bone. Sesamoid injuries may include irritation, dislocation, or a break (fracture) in a sesamoid bone. The most common sesamoid injuries affect the sesamoid bones under the big toe. These bones help you move forward during weight-bearing activities. What are the causes? This condition is caused by damage to a sesamoid bone or a surrounding tendon. What increases the risk? This condition is more likely to develop in people who:  Dance and practice ballet.  Run.  Play sports.  Are active on artificial turf.  Wear high heels. What are the signs or symptoms? Symptoms of this condition include:  Pain in the affected hand, foot, or knee.  Pain when you try to straighten the affected toe or finger.  A popping sound that happens at the time of injury.  Swelling.  Bruising. How is this diagnosed? This condition is diagnosed with:  A physical exam.  Observation of your movement while you walk.  An X-ray.  Bone scans. How is this treated? Treatment for this condition depends on the location, type, and severity of the injury. Treatment may involve:  Resting the affected area and avoiding activities that are causing injury.  Applying ice to the affected area.  Taking over-the-counter pain medicine.  Placing a cushioned pad in the shoe of the affected foot.  Taping the affected finger or toe to prevent movement.  Getting steroid injections.  Wearing a cast, brace, or orthotic shoe.  Doing physical therapy. Surgery may be needed if other treatments do not work. Follow these instructions at home: If you have a cast:  Do not put pressure on any part of the cast until it is  fully hardened. This may take several hours.  Do not stick anything inside the cast to scratch your skin. Doing that increases your risk of infection.  Check the skin around it every day. Tell your health care provider about any concerns.  You may put lotion on dry skin around the edges of the cast. Do not put lotion on the skin underneath it.  Keep it clean and dry. If you have a brace or orthotic shoe:  Wear it as told by your health care provider. Remove it only as told by your health care provider.  Loosen it if your fingers or toes tingle, become numb, or turn cold and blue.  Keep it clean and dry. Bathing  Do not take baths, swim, or use a hot tub until your health care provider approves. Ask your health care provider if you may take showers. You may only be allowed to take sponge baths.  If the cast, brace, or orthotic shoe is not waterproof: ? Do not let it get wet. ? Cover it with a watertight covering when you take a bath or shower. Managing pain, stiffness, and swelling   If directed, put ice on the injured area. To do this: ? If you have a removable brace or orthotic shoe, remove it as told by your health care provider. ? Put ice in a plastic bag. ? Place a towel between your skin and the bag or between your cast and the bag. ? Leave the ice on for 20 minutes, 2-3 times a   day.  Move your fingers or toes often to reduce stiffness and swelling.  Raise (elevate) the injured area above the level of your heart while you are sitting or lying down. Driving  Ask your health care provider if the medicine prescribed to you requires you to avoid driving or using heavy machinery.  Ask your health care provider when it is safe to drive if you have a cast, brace, or orthotic shoe on a foot that you use for driving. Activity  Return to your normal activities as told by your health care provider. Ask your health care provider what activities are safe for you.  Do not use the  injured limb to support your body weight until your health care provider says that you can. Use crutches as told by your health care provider.  Do exercises as told by your health care provider or physical therapist. General instructions  Take over-the-counter and prescription medicines only as told by your health care provider.  Do not use any products that contain nicotine or tobacco, such as cigarettes, e-cigarettes, and chewing tobacco. If you need help quitting, ask your health care provider.  Keep all follow-up visits as told by your health care provider. This is important. Contact a health care provider if:  You have pain and swelling that continues, even with treatment.  Your pain and swelling returns after you get back to your normal activities.  You cannot put pressure on your foot. Get help right away if:  You lose sensation in the affected area.  Your fingers or toes turn cold and blue. Summary  A sesamoid injury happens when a sesamoid bone or a surrounding tendon gets damaged during activity.  Symptoms of this condition include pain in the affected area, a popping sound at the time of injury, swelling, and bruising.  Treatment for this condition depends on the location, type, and severity of the injury. This information is not intended to replace advice given to you by your health care provider. Make sure you discuss any questions you have with your health care provider. Document Released: 09/08/2005 Document Revised: 01/04/2019 Document Reviewed: 11/24/2018 Elsevier Patient Education  2020 [employer]  

## 2019-08-11 LAB — CMP14+EGFR
ALT: 25 IU/L (ref 0–44)
AST: 20 IU/L (ref 0–40)
Albumin/Globulin Ratio: 2 (ref 1.2–2.2)
Albumin: 4.6 g/dL (ref 4.0–5.0)
Alkaline Phosphatase: 62 IU/L (ref 39–117)
BUN/Creatinine Ratio: 13 (ref 9–20)
BUN: 12 mg/dL (ref 6–20)
Bilirubin Total: 0.5 mg/dL (ref 0.0–1.2)
CO2: 23 mmol/L (ref 20–29)
Calcium: 10.1 mg/dL (ref 8.7–10.2)
Chloride: 101 mmol/L (ref 96–106)
Creatinine, Ser: 0.95 mg/dL (ref 0.76–1.27)
GFR calc Af Amer: 123 mL/min/{1.73_m2} (ref >59)
GFR calc non Af Amer: 106 mL/min/{1.73_m2} (ref >59)
Globulin, Total: 2.3 g/dL (ref 1.5–4.5)
Glucose: 99 mg/dL (ref 65–99)
Potassium: 4.4 mmol/L (ref 3.5–5.2)
Sodium: 140 mmol/L (ref 134–144)
Total Protein: 6.9 g/dL (ref 6.0–8.5)

## 2019-08-11 LAB — CBC WITH DIFFERENTIAL/PLATELET
Basophils Absolute: 0.1 10*3/uL (ref 0.0–0.2)
Basos: 1 %
EOS (ABSOLUTE): 0.1 10*3/uL (ref 0.0–0.4)
Eos: 1 %
Hematocrit: 51.1 % — ABNORMAL HIGH (ref 37.5–51.0)
Hemoglobin: 17.3 g/dL (ref 13.0–17.7)
Immature Grans (Abs): 0.1 10*3/uL (ref 0.0–0.1)
Immature Granulocytes: 1 %
Lymphocytes Absolute: 2.8 10*3/uL (ref 0.7–3.1)
Lymphs: 34 %
MCH: 29.3 pg (ref 26.6–33.0)
MCHC: 33.9 g/dL (ref 31.5–35.7)
MCV: 87 fL (ref 79–97)
Monocytes Absolute: 0.5 10*3/uL (ref 0.1–0.9)
Monocytes: 6 %
Neutrophils Absolute: 4.6 10*3/uL (ref 1.4–7.0)
Neutrophils: 57 %
Platelets: 210 10*3/uL (ref 150–450)
RBC: 5.9 x10E6/uL — ABNORMAL HIGH (ref 4.14–5.80)
RDW: 12.5 % (ref 11.6–15.4)
WBC: 8.1 10*3/uL (ref 3.4–10.8)

## 2019-08-11 LAB — URIC ACID: Uric Acid: 9.7 mg/dL — ABNORMAL HIGH (ref 3.7–8.6)

## 2019-08-11 LAB — VITAMIN D 25 HYDROXY (VIT D DEFICIENCY, FRACTURES): Vit D, 25-Hydroxy: 18.1 ng/mL — ABNORMAL LOW (ref 30.0–100.0)

## 2019-08-11 MED ORDER — VITAMIN D (ERGOCALCIFEROL) 1.25 MG (50000 UNIT) PO CAPS: 50000.0000 [IU] | ORAL_CAPSULE | ORAL | 6 refills | Status: DC

## 2019-08-11 MED ORDER — ALLOPURINOL 100 MG PO TABS: 100.0000 mg | ORAL_TABLET | Freq: Every day | ORAL | 6 refills | Status: DC

## 2019-08-11 NOTE — Addendum Note (Signed)
Addended by: Baruch Gouty on: 08/11/2019 01:32 PM   Modules accepted: Orders

## 2019-09-03 ENCOUNTER — Other Ambulatory Visit: Payer: Self-pay | Admitting: Family Medicine

## 2019-09-03 DIAGNOSIS — M10072 Idiopathic gout, left ankle and foot: Secondary | ICD-10-CM

## 2019-11-08 ENCOUNTER — Ambulatory Visit (INDEPENDENT_AMBULATORY_CARE_PROVIDER_SITE_OTHER): Payer: BC Managed Care – PPO | Admitting: Family

## 2019-11-08 ENCOUNTER — Encounter: Payer: Self-pay | Admitting: Family

## 2019-11-08 ENCOUNTER — Ambulatory Visit (INDEPENDENT_AMBULATORY_CARE_PROVIDER_SITE_OTHER): Payer: BC Managed Care – PPO

## 2019-11-08 ENCOUNTER — Other Ambulatory Visit: Payer: Self-pay

## 2019-11-08 VITALS — BP 151/94 | HR 110 | Temp 97.8°F | Ht 70.0 in | Wt 210.0 lb

## 2019-11-08 DIAGNOSIS — M109 Gout, unspecified: Secondary | ICD-10-CM | POA: Diagnosis not present

## 2019-11-08 DIAGNOSIS — M25571 Pain in right ankle and joints of right foot: Secondary | ICD-10-CM

## 2019-11-08 DIAGNOSIS — R030 Elevated blood-pressure reading, without diagnosis of hypertension: Secondary | ICD-10-CM

## 2019-11-08 MED ORDER — DICLOFENAC SODIUM 75 MG PO TBEC: 75.0000 mg | DELAYED_RELEASE_TABLET | Freq: Two times a day (BID) | ORAL | 1 refills | Status: AC

## 2019-11-08 MED ORDER — COLCHICINE 0.6 MG PO TABS: ORAL_TABLET | ORAL | 2 refills | Status: AC

## 2019-11-08 NOTE — Progress Notes (Signed)
Subjective:    Patient ID: Andre Hall, male    DOB: 22-Jun-1986, 34 y.o.   MRN: [id number]  Chief Complaint  Patient presents with  . Ankle Pain    right no injury    PT presents to the office today with right ankle pain of a 10 out 10 that started two days ago. He has a hx of gout and states it feels similar. He states he can not walk on his ankle.   He reports he quit drinking and stopped eating beef. He has been taking his allopurinol.  Ankle Pain  The incident occurred 2 days ago. There was no injury mechanism. The pain is present in the right ankle. The quality of the pain is described as aching. The pain is at a severity of 10/10. The pain is moderate. The pain has been constant since onset. Pertinent negatives include no loss of motion, numbness or tingling. He reports no foreign bodies present. The symptoms are aggravated by movement and weight bearing. He has tried nothing for the symptoms. The treatment provided no relief.      Review of Systems  Musculoskeletal:       Right ankle pain  Neurological: Negative for tingling and numbness.  All other systems reviewed and are negative.      Objective:   Physical Exam Vitals reviewed.  Constitutional:      General: He is not in acute distress.    Appearance: He is well-developed.  HENT:     Head: Normocephalic.  Eyes:     General:        Right eye: No discharge.        Left eye: No discharge.     Pupils: Pupils are equal, round, and reactive to light.  Neck:     Thyroid: No thyromegaly.  Cardiovascular:     Rate and Rhythm: Normal rate and regular rhythm.     Heart sounds: Normal heart sounds. No murmur.  Pulmonary:     Effort: Pulmonary effort is normal. No respiratory distress.     Breath sounds: Normal breath sounds. No wheezing.  Abdominal:     General: Bowel sounds are normal. There is no distension.     Palpations: Abdomen is soft.     Tenderness: There is no abdominal tenderness.  Musculoskeletal:         General: Swelling and tenderness present.     Cervical back: Normal range of motion and neck supple.     Comments: Right lateral ankle erythemas, swollen, and tender. Warmth present  Skin:    General: Skin is warm and dry.     Findings: No erythema or rash.  Neurological:     Mental Status: He is alert and oriented to person, place, and time.     Cranial Nerves: No cranial nerve deficit.     Deep Tendon Reflexes: Reflexes are normal and symmetric.  Psychiatric:        Behavior: Behavior normal.        Thought Content: Thought content normal.        Judgment: Judgment normal.      BP (!) 151/94   Pulse (!) 110   Temp 97.8 F (36.6 C) (Oral)   Ht 5' 10"  (1.778 m)   Wt 210 lb (95.3 kg)   SpO2 97%   BMI 30.13 kg/m       Assessment & Plan:  Andre Hall comes in today with chief complaint of Ankle Pain (right no  injury )   Diagnosis and orders addressed:  1. Acute right ankle pain Continue Low purine diet Start Colchicine today Diclofenac BID with food Labs pending, will need to increase allopurinol to 300 mg after a month - BMP8+EGFR - Uric acid - colchicine 0.6 MG tablet; Take 1.2 mg then a hour later take 0.6 mg every day until gout resolves  Dispense: 60 tablet; Refill: 2 - diclofenac (VOLTAREN) 75 MG EC tablet; Take 1 tablet (75 mg total) by mouth 2 (two) times daily.  Dispense: 60 tablet; Refill: 1 - DG Ankle Complete Right; Future  2. Acute gout of right ankle, unspecified cause - BMP8+EGFR - Uric acid - colchicine 0.6 MG tablet; Take 1.2 mg then a hour later take 0.6 mg every day until gout resolves  Dispense: 60 tablet; Refill: 2 - diclofenac (VOLTAREN) 75 MG EC tablet; Take 1 tablet (75 mg total) by mouth 2 (two) times daily.  Dispense: 60 tablet; Refill: 1   3. Elevated blood pressure reading Could be related to pain, told to follow up with PCP regarding this.    Evelina Dun, FNP

## 2019-11-08 NOTE — Patient Instructions (Signed)
Gout  Gout is a condition that causes painful swelling of the joints. Gout is a type of inflammation of the joints (arthritis). This condition is caused by having too much uric acid in the body. Uric acid is a chemical that forms when the body breaks down substances called purines. Purines are important for building body proteins. When the body has too much uric acid, sharp crystals can form and build up inside the joints. This causes pain and swelling. Gout attacks can happen quickly and may be very painful (acute gout). Over time, the attacks can affect more joints and become more frequent (chronic gout). Gout can also cause uric acid to build up under the skin and inside the kidneys. What are the causes? This condition is caused by too much uric acid in your blood. This can happen because:  Your kidneys do not remove enough uric acid from your blood. This is the most common cause.  Your body makes too much uric acid. This can happen with some cancers and cancer treatments. It can also occur if your body is breaking down too many red blood cells (hemolytic anemia).  You eat too many foods that are high in purines. These foods include organ meats and some seafood. Alcohol, especially beer, is also high in purines. A gout attack may be triggered by trauma or stress. What increases the risk? You are more likely to develop this condition if you:  Have a family history of gout.  Are male and middle-aged.  Are male and have gone through menopause.  Are obese.  Frequently drink alcohol, especially beer.  Are dehydrated.  Lose weight too quickly.  Have an organ transplant.  Have lead poisoning.  Take certain medicines, including aspirin, cyclosporine, diuretics, levodopa, and niacin.  Have kidney disease.  Have a skin condition called psoriasis. What are the signs or symptoms? An attack of acute gout happens quickly. It usually occurs in just one joint. The most common place is  the big toe. Attacks often start at night. Other joints that may be affected include joints of the feet, ankle, knee, fingers, wrist, or elbow. Symptoms of this condition may include:  Severe pain.  Warmth.  Swelling.  Stiffness.  Tenderness. The affected joint may be very painful to touch.  Shiny, red, or purple skin.  Chills and fever. Chronic gout may cause symptoms more frequently. More joints may be involved. You may also have white or yellow lumps (tophi) on your hands or feet or in other areas near your joints. How is this diagnosed? This condition is diagnosed based on your symptoms, medical history, and physical exam. You may have tests, such as:  Blood tests to measure uric acid levels.  Removal of joint fluid with a thin needle (aspiration) to look for uric acid crystals.  X-rays to look for joint damage. How is this treated? Treatment for this condition has two phases: treating an acute attack and preventing future attacks. Acute gout treatment may include medicines to reduce pain and swelling, including:  NSAIDs.  Steroids. These are strong anti-inflammatory medicines that can be taken by mouth (orally) or injected into a joint.  Colchicine. This medicine relieves pain and swelling when it is taken soon after an attack. It can be given by mouth or through an IV. Preventive treatment may include:  Daily use of smaller doses of NSAIDs or colchicine.  Use of a medicine that reduces uric acid levels in your blood.  Changes to your diet. You may   need to see a dietitian about what to eat and drink to prevent gout. Follow these instructions at home: During a gout attack   If directed, put ice on the affected area: ? Put ice in a plastic bag. ? Place a towel between your skin and the bag. ? Leave the ice on for 20 minutes, 2-3 times a day.  Raise (elevate) the affected joint above the level of your heart as often as possible.  Rest the joint as much as possible.  If the affected joint is in your leg, you may be given crutches to use.  Follow instructions from your health care provider about eating or drinking restrictions. Avoiding future gout attacks  Follow a low-purine diet as told by your dietitian or health care provider. Avoid foods and drinks that are high in purines, including liver, kidney, anchovies, asparagus, herring, mushrooms, mussels, and beer.  Maintain a healthy weight or lose weight if you are overweight. If you want to lose weight, talk with your health care provider. It is important that you do not lose weight too quickly.  Start or maintain an exercise program as told by your health care provider. Eating and drinking  Drink enough fluids to keep your urine pale yellow.  If you drink alcohol: ? Limit how much you use to:  0-1 drink a day for women.  0-2 drinks a day for men. ? Be aware of how much alcohol is in your drink. In the [place] one drink equals one 12 oz bottle of beer (355 mL) one 5 oz glass of wine (148 mL), or one 1 oz glass of hard liquor (44 mL). General instructions  Take over-the-counter and prescription medicines only as told by your health care provider.  Do not drive or use heavy machinery while taking prescription pain medicine.  Return to your normal activities as told by your health care provider. Ask your health care provider what activities are safe for you.  Keep all follow-up visits as told by your health care provider. This is important. Contact a health care provider if you have:  Another gout attack.  Continuing symptoms of a gout attack after 10 days of treatment.  Side effects from your medicines.  Chills or a fever.  Burning pain when you urinate.  Pain in your lower back or belly. Get help right away if you:  Have severe or uncontrolled pain.  Cannot urinate. Summary  Gout is painful swelling of the joints caused by inflammation.  The most common site of pain is the big  toe, but it can affect other joints in the body.  Medicines and dietary changes can help to prevent and treat gout attacks. This information is not intended to replace advice given to you by your health care provider. Make sure you discuss any questions you have with your health care provider. Document Revised: 03/31/2018 Document Reviewed: 03/31/2018 Elsevier Patient Education  2020 [employer]  

## 2019-11-09 LAB — BMP8+EGFR
BUN/Creatinine Ratio: 12 (ref 9–20)
BUN: 12 mg/dL (ref 6–20)
CO2: 24 mmol/L (ref 20–29)
Calcium: 10 mg/dL (ref 8.7–10.2)
Chloride: 105 mmol/L (ref 96–106)
Creatinine, Ser: 1 mg/dL (ref 0.76–1.27)
GFR calc Af Amer: 115 mL/min/{1.73_m2} (ref >59)
GFR calc non Af Amer: 99 mL/min/{1.73_m2} (ref >59)
Glucose: 91 mg/dL (ref 65–99)
Potassium: 4.5 mmol/L (ref 3.5–5.2)
Sodium: 145 mmol/L — ABNORMAL HIGH (ref 134–144)

## 2019-11-09 LAB — URIC ACID: Uric Acid: 9.1 mg/dL — ABNORMAL HIGH (ref 3.8–8.4)

## 2019-11-11 ENCOUNTER — Other Ambulatory Visit: Payer: Self-pay | Admitting: Family

## 2019-11-11 MED ORDER — ALLOPURINOL 300 MG PO TABS: 300.0000 mg | ORAL_TABLET | Freq: Every day | ORAL | 1 refills | Status: AC

## 2019-11-14 ENCOUNTER — Telehealth: Payer: Self-pay | Admitting: Family Medicine

## 2019-11-14 MED ORDER — PREDNISONE 10 MG (21) PO TBPK: ORAL_TABLET | ORAL | 0 refills | Status: AC

## 2019-11-14 NOTE — Telephone Encounter (Signed)
What should he do?

## 2019-11-14 NOTE — Telephone Encounter (Signed)
Yes, ok to extend work note.

## 2019-11-14 NOTE — Telephone Encounter (Signed)
Pt has not been back to work because of the pain. He was supposed to go back to work on 11/11/19, can you extend his note till 11/16/19

## 2019-11-14 NOTE — Telephone Encounter (Signed)
Prednisone Prescription sent to pharmacy   

## 2019-12-01 DIAGNOSIS — Z8739 Personal history of other diseases of the musculoskeletal system and connective tissue: Secondary | ICD-10-CM | POA: Diagnosis not present

## 2019-12-01 DIAGNOSIS — S46912A Strain of unspecified muscle, fascia and tendon at shoulder and upper arm level, left arm, initial encounter: Secondary | ICD-10-CM | POA: Diagnosis not present

## 2019-12-01 DIAGNOSIS — Z6830 Body mass index (BMI) 30.0-30.9, adult: Secondary | ICD-10-CM | POA: Diagnosis not present

## 2019-12-01 DIAGNOSIS — K219 Gastro-esophageal reflux disease without esophagitis: Secondary | ICD-10-CM | POA: Diagnosis not present

## 2020-02-09 ENCOUNTER — Other Ambulatory Visit: Payer: Self-pay | Admitting: *Deleted

## 2020-02-09 DIAGNOSIS — E559 Vitamin D deficiency, unspecified: Secondary | ICD-10-CM

## 2020-02-09 MED ORDER — VITAMIN D (ERGOCALCIFEROL) 1.25 MG (50000 UNIT) PO CAPS: 50000.0000 [IU] | ORAL_CAPSULE | ORAL | 0 refills | Status: AC

## 2020-05-11 DIAGNOSIS — Z20828 Contact with and (suspected) exposure to other viral communicable diseases: Secondary | ICD-10-CM | POA: Diagnosis not present

## 2020-11-13 ENCOUNTER — Other Ambulatory Visit: Payer: Self-pay | Admitting: Family
